# Patient Record
Sex: Female | Born: 1965 | Race: White | Hispanic: No | Marital: Married | Smoking: Never smoker
Health system: Southern US, Community
[De-identification: ages and names within clinical notes are randomized; demographics above are authoritative.]

## PROBLEM LIST (undated history)

## (undated) DIAGNOSIS — D649 Anemia, unspecified: Secondary | ICD-10-CM

## (undated) DIAGNOSIS — I10 Essential (primary) hypertension: Secondary | ICD-10-CM

## (undated) DIAGNOSIS — J45909 Unspecified asthma, uncomplicated: Secondary | ICD-10-CM

## (undated) DIAGNOSIS — K219 Gastro-esophageal reflux disease without esophagitis: Secondary | ICD-10-CM

## (undated) HISTORY — PX: REFRACTIVE SURGERY: SHX103

## (undated) HISTORY — DX: Unspecified asthma, uncomplicated: J45.909

## (undated) HISTORY — PX: TUBAL LIGATION: SHX77

## (undated) HISTORY — PX: ESOPHAGOGASTRODUODENOSCOPY: SHX1529

## (undated) HISTORY — PX: OTHER SURGICAL HISTORY: SHX169

## (undated) HISTORY — DX: Essential (primary) hypertension: I10

## (undated) HISTORY — PX: CHOLECYSTECTOMY: SHX55

---

## 2001-07-01 ENCOUNTER — Encounter: Payer: Self-pay | Admitting: Unknown Physician Specialty

## 2001-07-01 ENCOUNTER — Encounter: Admission: RE | Admit: 2001-07-01 | Discharge: 2001-07-01 | Payer: Self-pay | Admitting: Unknown Physician Specialty

## 2003-02-09 ENCOUNTER — Ambulatory Visit (HOSPITAL_COMMUNITY): Admission: RE | Admit: 2003-02-09 | Discharge: 2003-02-09 | Payer: Self-pay | Admitting: Urology

## 2003-02-11 ENCOUNTER — Ambulatory Visit (HOSPITAL_COMMUNITY): Admission: RE | Admit: 2003-02-11 | Discharge: 2003-02-11 | Payer: Self-pay | Admitting: Urology

## 2007-10-06 ENCOUNTER — Encounter: Admission: RE | Admit: 2007-10-06 | Discharge: 2007-10-06 | Payer: Self-pay | Admitting: Unknown Physician Specialty

## 2008-10-07 ENCOUNTER — Encounter: Admission: RE | Admit: 2008-10-07 | Discharge: 2008-10-07 | Payer: Self-pay | Admitting: Unknown Physician Specialty

## 2009-10-10 ENCOUNTER — Encounter: Admission: RE | Admit: 2009-10-10 | Discharge: 2009-10-10 | Payer: Self-pay | Admitting: Unknown Physician Specialty

## 2010-08-03 NOTE — H&P (Signed)
NAME:  Jean Jean [name], Jean Jean [name]                            ACCOUNT NO.:  [id number]   MEDICAL RECORD NO.:  [id number]                   PATIENT TYPE:  AMB   LOCATION:  DAY                                  FACILITY:  [facility]   PHYSICIAN:  Dennie Maizes, M.D.                DATE OF BIRTH:  Sep 23, 1965   DATE OF ADMISSION:  02/09/2003  DATE OF DISCHARGE:                                HISTORY & PHYSICAL   CHIEF COMPLAINT:  Severe left lower quadrant abdominal pain, suprapubic  pain, nausea and vomiting.   HISTORY OF PRESENT ILLNESS:  This 45 year old female has Jean [name] past history of  recurrent urolithiasis.  She had undergone ureteroscopy and stone extraction  about two years ago.  She experienced sudden onset of sharp, left lower  quadrant abdominal pain two weeks ago.  This was associated with nausea and  vomiting.  There was no radiation of pain to the back.  The patient was  evaluated in the emergency room at [facility].  CT of the  abdomen and pelvis revealed Jean [name] 10 x 5-mm-size left distal ureteral calculus  with obstruction and hydronephrosis.  The patient has not passed Jean [name] stone  yet.  She has relief of pain at present.  There is no history of fever,  chills or hematuria.  X-ray of the KUB area revealed presence of the stones  in the left distal ureteral area.  The patient is brought to the day  hospital today for extracorporeal shock wave lithotripsy of the obstructing  left distal ureteral calculus.   PAST MEDICAL HISTORY:  1. History of bronchial asthma.  2. Urolithiasis, status post ureteroscopic stone extraction.  3. Status post cholecystectomy.  4. Status post removal of left nasal polyps.   MEDICATIONS:  PERCOCET.   ALLERGIES:  None.   EXAMINATION:  GENERAL:  Height 5 feet 3 inches.  Weight 260 pounds.  HEENT:  Head, eyes, ears, nose and throat normal.  NECK:  No masses.  LUNGS:  Lungs clear to auscultation.  HEART:  Regular rate and rhythm.  No murmurs.  ABDOMEN:   Abdomen soft.  No palpable flank mass.  No costovertebral angle  tenderness.  Bladder is not palpable.  No suprapubic tenderness.   IMPRESSION:  Left distal ureteral calculus with obstruction, left  hydronephrosis, left renal colic.   PLAN:  I discussed with the patient regarding the management options.  She  is scheduled to undergo ESWL of the left distal ureteral calculus with IV  sedation in day hospital.  I have informed her regarding the diagnosis,  operative details, the alternate treatments, the outcome, possible risks and  complications and she has agreed for the procedure to be done.     ___________________________________________  Dennie Maizes, M.D.   SK/MEDQ  D:  02/09/2003  T:  02/09/2003  Job:  [id number]   cc:   Darliss CheneyD.

## 2010-09-05 ENCOUNTER — Other Ambulatory Visit: Payer: Self-pay | Admitting: Unknown Physician Specialty

## 2010-09-05 ENCOUNTER — Other Ambulatory Visit: Payer: Self-pay | Admitting: *Deleted

## 2010-09-05 DIAGNOSIS — Z1231 Encounter for screening mammogram for malignant neoplasm of breast: Secondary | ICD-10-CM

## 2010-10-15 ENCOUNTER — Ambulatory Visit
Admission: RE | Admit: 2010-10-15 | Discharge: 2010-10-15 | Disposition: A | Discharge status: Home / Self Care | Payer: 59 | Source: Ambulatory Visit | Attending: *Deleted | Admitting: *Deleted

## 2010-10-15 DIAGNOSIS — Z1231 Encounter for screening mammogram for malignant neoplasm of breast: Secondary | ICD-10-CM

## 2011-10-03 ENCOUNTER — Other Ambulatory Visit: Payer: Self-pay | Admitting: Unknown Physician Specialty

## 2011-10-03 DIAGNOSIS — Z1231 Encounter for screening mammogram for malignant neoplasm of breast: Secondary | ICD-10-CM

## 2011-10-25 ENCOUNTER — Ambulatory Visit
Admission: RE | Admit: 2011-10-25 | Discharge: 2011-10-25 | Disposition: A | Discharge status: Home / Self Care | Payer: BC Managed Care – PPO | Source: Ambulatory Visit | Attending: Unknown Physician Specialty | Admitting: Unknown Physician Specialty

## 2011-10-25 DIAGNOSIS — Z1231 Encounter for screening mammogram for malignant neoplasm of breast: Secondary | ICD-10-CM

## 2012-08-24 ENCOUNTER — Other Ambulatory Visit: Payer: Self-pay

## 2012-08-24 DIAGNOSIS — Z1231 Encounter for screening mammogram for malignant neoplasm of breast: Secondary | ICD-10-CM

## 2012-10-26 ENCOUNTER — Ambulatory Visit: Payer: BC Managed Care – PPO

## 2012-10-26 ENCOUNTER — Ambulatory Visit
Admission: RE | Admit: 2012-10-26 | Discharge: 2012-10-26 | Disposition: A | Discharge status: Home / Self Care | Payer: BC Managed Care – PPO | Source: Ambulatory Visit

## 2012-10-26 DIAGNOSIS — Z1231 Encounter for screening mammogram for malignant neoplasm of breast: Secondary | ICD-10-CM

## 2013-10-13 ENCOUNTER — Other Ambulatory Visit: Payer: Self-pay

## 2013-10-13 DIAGNOSIS — Z1231 Encounter for screening mammogram for malignant neoplasm of breast: Secondary | ICD-10-CM

## 2013-10-27 ENCOUNTER — Inpatient Hospital Stay: Admission: RE | Admit: 2013-10-27 | Payer: BC Managed Care – PPO | Source: Ambulatory Visit

## 2013-10-28 ENCOUNTER — Encounter (INDEPENDENT_AMBULATORY_CARE_PROVIDER_SITE_OTHER): Payer: Self-pay

## 2013-10-28 ENCOUNTER — Ambulatory Visit
Admission: RE | Admit: 2013-10-28 | Discharge: 2013-10-28 | Disposition: A | Discharge status: Home / Self Care | Payer: BC Managed Care – PPO | Source: Ambulatory Visit

## 2013-10-28 DIAGNOSIS — Z1231 Encounter for screening mammogram for malignant neoplasm of breast: Secondary | ICD-10-CM

## 2014-06-13 ENCOUNTER — Encounter (INDEPENDENT_AMBULATORY_CARE_PROVIDER_SITE_OTHER): Payer: Self-pay | Admitting: *Deleted

## 2014-07-06 ENCOUNTER — Ambulatory Visit (INDEPENDENT_AMBULATORY_CARE_PROVIDER_SITE_OTHER): Payer: Self-pay | Admitting: Internal Medicine

## 2014-07-07 ENCOUNTER — Other Ambulatory Visit (INDEPENDENT_AMBULATORY_CARE_PROVIDER_SITE_OTHER): Payer: Self-pay | Admitting: *Deleted

## 2014-07-07 ENCOUNTER — Ambulatory Visit (INDEPENDENT_AMBULATORY_CARE_PROVIDER_SITE_OTHER): Payer: BLUE CROSS/BLUE SHIELD | Admitting: Internal Medicine

## 2014-07-07 ENCOUNTER — Encounter (INDEPENDENT_AMBULATORY_CARE_PROVIDER_SITE_OTHER): Payer: Self-pay | Admitting: *Deleted

## 2014-07-07 ENCOUNTER — Encounter (INDEPENDENT_AMBULATORY_CARE_PROVIDER_SITE_OTHER): Payer: Self-pay | Admitting: Internal Medicine

## 2014-07-07 VITALS — BP 102/58 | HR 76 | Temp 97.6°F | Ht 63.0 in | Wt 276.4 lb

## 2014-07-07 DIAGNOSIS — R1314 Dysphagia, pharyngoesophageal phase: Secondary | ICD-10-CM | POA: Diagnosis not present

## 2014-07-07 DIAGNOSIS — K219 Gastro-esophageal reflux disease without esophagitis: Secondary | ICD-10-CM

## 2014-07-07 DIAGNOSIS — I10 Essential (primary) hypertension: Secondary | ICD-10-CM | POA: Insufficient documentation

## 2014-07-07 DIAGNOSIS — J45909 Unspecified asthma, uncomplicated: Secondary | ICD-10-CM | POA: Insufficient documentation

## 2014-07-07 DIAGNOSIS — R1310 Dysphagia, unspecified: Secondary | ICD-10-CM

## 2014-07-07 MED ORDER — OMEPRAZOLE 40 MG PO CPDR: 40.0000 mg | DELAYED_RELEASE_CAPSULE | Freq: Every day | ORAL | Status: DC

## 2014-07-07 NOTE — Progress Notes (Signed)
   Subjective:    Patient ID: Jean BlancAmy A Sevey, female    DOB: 07/17/1965, 49 y.o.   MRN: [id number]  HPI Presents today with c/o dysphagia. She tells me breads and meats are lodging in her esophagus. Foods will either go down slowly or she will have to force it back up. Symptoms for about a year and a half.  Hx of same and underwent and EGD/ED in 2011 for same. 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 Appetite is good. No weight loss. Usually has a BM . No melena or BRRB. No abdominal pain.   She denies acid reflux. No NSAIDs.    Review of Systems  Married. One child in good health.   Past Medical History  Diagnosis Date  . Asthma   . Hypertension     Past Surgical History  Procedure Laterality Date  . Tubal ligation    . Cholecystectomy      gallstones  . Cbd stones    . Kidney stones      laser, lithrotripsy  . Esophagogastroduodenoscopy      EGD/ED 2011.   Marland Kitchen. Refractive surgery      No Known Allergies  No current outpatient prescriptions on file prior to visit.   No current facility-administered medications on file prior to visit.        Objective:   Physical Exam Blood pressure 102/58, pulse 76, temperature 97.6 F (36.4 C), height 5\' 3"  (1.6 m), weight 276 lb 6.4 oz (125.374 kg).  Alert and oriented. Skin warm and dry. Oral mucosa is moist.   . Sclera anicteric, conjunctivae is pink. Thyroid not enlarged. No cervical lymphadenopathy. Lungs clear. Heart regular rate and rhythm.  Abdomen is soft. Bowel sounds are positive. No hepatomegaly. No abdominal masses felt. No tenderness.  No edema to lower extremities.         Assessment & Plan:  Solid food dysphagia. Esophageal ring needs to be ruled out. EGD/ED. The risks and benefits such as perforation, bleeding, and infection were reviewed with the patient and is agreeable.The risks and benefits such as perforation, bleeding, and infection were reviewed with the patient and is  agreeable. Rx for Omeprazole 40mg  sent to her pharmacy.

## 2014-07-07 NOTE — Patient Instructions (Signed)
EGD/ED. The risks and benefits such as perforation, bleeding, and infection were reviewed with the patient and is agreeable. Rx for Omeprazole.

## 2014-07-22 ENCOUNTER — Ambulatory Visit (HOSPITAL_COMMUNITY)
Admission: RE | Admit: 2014-07-22 | Discharge: 2014-07-22 | Disposition: A | Discharge status: Home / Self Care | Payer: BLUE CROSS/BLUE SHIELD | Source: Ambulatory Visit | Attending: Internal Medicine | Admitting: Internal Medicine

## 2014-07-22 ENCOUNTER — Encounter (HOSPITAL_COMMUNITY): Payer: Self-pay

## 2014-07-22 ENCOUNTER — Encounter (HOSPITAL_COMMUNITY)
Admission: RE | Disposition: A | Discharge status: Home / Self Care | Payer: Self-pay | Source: Ambulatory Visit | Attending: Internal Medicine

## 2014-07-22 DIAGNOSIS — K2901 Acute gastritis with bleeding: Secondary | ICD-10-CM | POA: Diagnosis not present

## 2014-07-22 DIAGNOSIS — J45909 Unspecified asthma, uncomplicated: Secondary | ICD-10-CM | POA: Insufficient documentation

## 2014-07-22 DIAGNOSIS — R1310 Dysphagia, unspecified: Secondary | ICD-10-CM | POA: Diagnosis not present

## 2014-07-22 DIAGNOSIS — K2950 Unspecified chronic gastritis without bleeding: Secondary | ICD-10-CM | POA: Insufficient documentation

## 2014-07-22 DIAGNOSIS — K228 Other specified diseases of esophagus: Secondary | ICD-10-CM | POA: Diagnosis not present

## 2014-07-22 DIAGNOSIS — Z87442 Personal history of urinary calculi: Secondary | ICD-10-CM | POA: Diagnosis not present

## 2014-07-22 DIAGNOSIS — D649 Anemia, unspecified: Secondary | ICD-10-CM | POA: Diagnosis not present

## 2014-07-22 DIAGNOSIS — K222 Esophageal obstruction: Secondary | ICD-10-CM | POA: Diagnosis not present

## 2014-07-22 DIAGNOSIS — K3189 Other diseases of stomach and duodenum: Secondary | ICD-10-CM | POA: Diagnosis not present

## 2014-07-22 DIAGNOSIS — K219 Gastro-esophageal reflux disease without esophagitis: Secondary | ICD-10-CM | POA: Diagnosis not present

## 2014-07-22 DIAGNOSIS — Z9851 Tubal ligation status: Secondary | ICD-10-CM | POA: Insufficient documentation

## 2014-07-22 DIAGNOSIS — Z9049 Acquired absence of other specified parts of digestive tract: Secondary | ICD-10-CM | POA: Diagnosis not present

## 2014-07-22 DIAGNOSIS — I10 Essential (primary) hypertension: Secondary | ICD-10-CM | POA: Diagnosis not present

## 2014-07-22 HISTORY — DX: Gastro-esophageal reflux disease without esophagitis: K21.9

## 2014-07-22 HISTORY — PX: ESOPHAGOGASTRODUODENOSCOPY: SHX5428

## 2014-07-22 HISTORY — PX: ESOPHAGEAL DILATION: SHX303

## 2014-07-22 HISTORY — DX: Anemia, unspecified: D64.9

## 2014-07-22 SURGERY — EGD (ESOPHAGOGASTRODUODENOSCOPY)
Anesthesia: Moderate Sedation

## 2014-07-22 MED ORDER — MEPERIDINE HCL 50 MG/ML IJ SOLN
INTRAMUSCULAR | Status: DC | PRN
  Administered 2014-07-22 (×2): 25 mg via INTRAVENOUS

## 2014-07-22 MED ORDER — MIDAZOLAM HCL 5 MG/5ML IJ SOLN
INTRAMUSCULAR | Status: AC
  Filled 2014-07-22: qty 10

## 2014-07-22 MED ORDER — BUTAMBEN-TETRACAINE-BENZOCAINE 2-2-14 % EX AERO
INHALATION_SPRAY | CUTANEOUS | Status: DC | PRN
  Administered 2014-07-22: 2 via TOPICAL

## 2014-07-22 MED ORDER — MIDAZOLAM HCL 5 MG/5ML IJ SOLN
INTRAMUSCULAR | Status: DC | PRN
  Administered 2014-07-22: 1 mg via INTRAVENOUS
  Administered 2014-07-22 (×3): 2 mg via INTRAVENOUS
  Administered 2014-07-22 (×3): 1 mg via INTRAVENOUS

## 2014-07-22 MED ORDER — SODIUM CHLORIDE 0.9 % IV SOLN
INTRAVENOUS | Status: DC
  Administered 2014-07-22: 08:00:00 via INTRAVENOUS

## 2014-07-22 MED ORDER — MEPERIDINE HCL 50 MG/ML IJ SOLN
INTRAMUSCULAR | Status: AC
  Filled 2014-07-22: qty 1

## 2014-07-22 MED ORDER — STERILE WATER FOR IRRIGATION IR SOLN
Status: DC | PRN
  Administered 2014-07-22: 09:00:00

## 2014-07-22 NOTE — Discharge Instructions (Signed)
Resume usual medications including omeprazole and take it 30 minutes before breakfast daily. Soft foods for 24 hours; then usual diet. No driving for 24 hours. Physician will call with biopsy results.  Esophagogastroduodenoscopy Care After Refer to this sheet in the next few weeks. These instructions provide you with information on caring for yourself after your procedure. Your caregiver may also give you more specific instructions. Your treatment has been planned according to current medical practices, but problems sometimes occur. Call your caregiver if you have any problems or questions after your procedure.  HOME CARE INSTRUCTIONS  Do not eat or drink anything until the numbing medicine (local anesthetic) has worn off and your gag reflex has returned. You will know that the local anesthetic has worn off when you can swallow comfortably.  Do not drive for 12 hours after the procedure or as directed by your caregiver.  Only take medicines as directed by your caregiver. SEEK MEDICAL CARE IF:   You cannot stop coughing.  You are not urinating at all or less than usual. SEEK IMMEDIATE MEDICAL CARE IF:  You have difficulty swallowing.  You cannot eat or drink.  You have worsening throat or chest pain.  You have dizziness, lightheadedness, or you faint.  You have nausea or vomiting.  You have chills.  You have a fever.  You have severe abdominal pain.  You have black, tarry, or bloody stools. Document Released: 02/19/2012 Document Reviewed: 02/19/2012 [facility] Patient Information 2015 [place], [place]. This information is not intended to replace advice given to you by your health care provider. Make sure you discuss any questions you have with your health care provider.

## 2014-07-22 NOTE — Op Note (Signed)
EGD PROCEDURE REPORT  PATIENT:  Jean [name]  MR#:  [id number] Birthdate:  10/27/1965, 49 y.o., female Endoscopist:  Dr. Malissa HippoNajeeb U. Maidie Streight, MD Referred By:  Dr. Ignatius Speckinghruv B. Vyas, MD Procedure Date: 07/22/2014  Procedure:   EGD with ED.  Indications:  Patient is [age] old Caucasian female who presents with one-year history of intermittent solid food dysphagia. Her esophagus was last dilated April 2011 admission she also had esophageal biopsy and was negative for eosinophilic esophagitis. She has occasional heartburn.            Informed Consent:  The risks, benefits, alternatives & imponderables which include, but are not limited to, bleeding, infection, perforation, drug reaction and potential missed lesion have been reviewed.  The potential for biopsy, lesion removal, esophageal dilation, etc. have also been discussed.  Questions have been answered.  All parties agreeable.  Please see history & physical in medical record for more information.  Medications:  Demerol 50 mg IV Versed 10 mg IV Cetacaine spray topically for oropharyngeal anesthesia  Description of procedure:  The endoscope was introduced through the mouth and advanced to the second portion of the duodenum without difficulty or limitations. The mucosal surfaces were surveyed very carefully during advancement of the scope and upon withdrawal.  Findings:  Esophagus:  Mucosa of proximal and middle segment was normal. Ring noted at GE junction along with two other incomplete rings. GE junction was wavy with single patch of salmon colored mucosa 12 mm tall. GEJ:  38 cm Stomach:  Stomach was empty and distended very well with insufflation. Folds in the proximal stomach normal. Examination of mucosa at gastric body was normal. Multiple anterior erosions noted. Pyloric channel was patent. Angularis fundus and cardia were normal. Duodenum:  Bulbar and post bulbar mucosa.  Therapeutic/Diagnostic Maneuvers Performed:   Esophagus dilated by passing  56 French Maloney dilator to full insertion. Endoscope was passed again and mucosa reexamined. About 2 cm linear mucosal disruption noted at distal esophagus straddling the ring. Biopsy was taken from salmon colored patch mucosa at distal esophagus. Antral biopsy was also taken for CLOtest.   Complications:  None  Impression: Ring at GE junction. Esophagus dilated by passing 56 French Maloney dilator and ring was disrupted.  Single patch of salmon current mucosa at distal esophagus. Biopsies taken post dilation to rule out short segment Barrett's esophagus.  Erosive antral gastritis. Biopsy taken for CLOtest.   Recommendations:  Standard instructions given. Patient advised to take omeprazole 40 mg by mouth every morning. I will be contacting patient with biopsy results and further recommendations.   Teiara Baria U  07/22/2014  9:48 AM  CC: Dr. Ignatius SpeckingVYAS,DHRUV B., MD & Dr. Bonnetta BarryNo ref. provider found

## 2014-07-22 NOTE — H&P (Signed)
Aryiana A Stevan BornHylton is an 49 y.o. female.   Chief Complaint: She is here for EGD and ED. HPI: A shunt is 49 year old Caucasian female who presents with one-year history of intermittent solid food dysphagia. She points to suprasternal area soft bolus obstruction. She usually gets spontaneous early but every now and then she has to regurgitate food bolus Spontaneously or self induced in order to get relief. She had has heartburn no more than once a week. She she is not taking PPI. She has good appetite and has not lost any weight. She denies melena. [facility]  Past Medical History  Diagnosis Date  . Asthma   . Hypertension   . GERD (gastroesophageal reflux disease)   . Anemia     Past Surgical History  Procedure Laterality Date  . Tubal ligation    . Cholecystectomy      gallstones  . Cbd stones    . Kidney stones      laser, lithrotripsy  . Esophagogastroduodenoscopy      EGD/ED 2011.   Marland Kitchen. Refractive surgery      History reviewed. No pertinent family history. Social History:  reports that she has never smoked. She does not have any smokeless tobacco history on file. She reports that she drinks alcohol. She reports that she does not use illicit drugs.  Allergies: No Known Allergies  Medications Prior to Admission  Medication Sig Dispense Refill  . budesonide-formoterol (SYMBICORT) 160-4.5 MCG/ACT inhaler Inhale 2 puffs into the lungs 2 (two) times daily.    . ergocalciferol (VITAMIN D2) 50000 UNITS capsule Take 50,000 Units by mouth once a week.    Marland Kitchen. lisinopril-hydrochlorothiazide (PRINZIDE,ZESTORETIC) 10-12.5 MG per tablet Take 1 tablet by mouth daily.    . montelukast (SINGULAIR) 10 MG tablet Take 10 mg by mouth at bedtime.    Marland Kitchen. omeprazole (PRILOSEC) 40 MG capsule Take 1 capsule (40 mg total) by mouth daily. 90 capsule 3  . albuterol (PROVENTIL HFA;VENTOLIN HFA) 108 (90 BASE) MCG/ACT inhaler Inhale into the lungs every 6 (six) hours as needed for wheezing  or shortness of breath.      No results found for this or any previous visit (from the past 48 hour(s)). No results found.  ROS  Blood pressure 113/64, pulse 71, temperature 98 F (36.7 C), temperature source Oral, resp. rate 18, height 5\' 3"  (1.6 m), weight 276 lb (125.193 kg), last menstrual period 06/22/2014, SpO2 100 %. Physical Exam  Constitutional:  Well-developed obese Caucasian female in NAD  HENT:  Mouth/Throat: Oropharynx is clear and moist.  Eyes: Conjunctivae are normal. No scleral icterus.  Neck: No thyromegaly present.  Cardiovascular: Normal rate, regular rhythm and normal heart sounds.   No murmur heard. Respiratory: Effort normal and breath sounds normal.  GI: Soft. She exhibits no distension and no mass. There is no tenderness.  Musculoskeletal: She exhibits no edema.  Lymphadenopathy:    She has no cervical adenopathy.  Neurological: She is alert.  Skin: Skin is warm and dry.     Assessment/Plan Solid food dysphagia. EGD with ED  Jahrel Borthwick U 07/22/2014, 9:02 AM

## 2014-07-23 LAB — CLOTEST (H. PYLORI), BIOPSY: Helicobacter screen: NEGATIVE

## 2014-07-25 ENCOUNTER — Encounter (HOSPITAL_COMMUNITY): Payer: Self-pay | Admitting: Internal Medicine

## 2014-08-01 ENCOUNTER — Encounter (INDEPENDENT_AMBULATORY_CARE_PROVIDER_SITE_OTHER): Payer: Self-pay | Admitting: *Deleted

## 2014-10-25 ENCOUNTER — Other Ambulatory Visit: Payer: Self-pay

## 2014-10-25 DIAGNOSIS — Z1231 Encounter for screening mammogram for malignant neoplasm of breast: Secondary | ICD-10-CM

## 2014-11-01 ENCOUNTER — Ambulatory Visit: Payer: BLUE CROSS/BLUE SHIELD

## 2014-11-15 ENCOUNTER — Ambulatory Visit: Payer: BLUE CROSS/BLUE SHIELD

## 2015-02-03 ENCOUNTER — Other Ambulatory Visit (INDEPENDENT_AMBULATORY_CARE_PROVIDER_SITE_OTHER): Payer: Self-pay | Admitting: Internal Medicine

## 2015-04-03 ENCOUNTER — Other Ambulatory Visit (INDEPENDENT_AMBULATORY_CARE_PROVIDER_SITE_OTHER): Payer: Self-pay | Admitting: Internal Medicine

## 2016-10-07 ENCOUNTER — Other Ambulatory Visit: Payer: Self-pay | Admitting: Obstetrics & Gynecology

## 2016-10-07 DIAGNOSIS — Z1231 Encounter for screening mammogram for malignant neoplasm of breast: Secondary | ICD-10-CM

## 2016-11-22 ENCOUNTER — Other Ambulatory Visit (HOSPITAL_COMMUNITY)
Admission: RE | Admit: 2016-11-22 | Discharge: 2016-11-22 | Disposition: A | Discharge status: Home / Self Care | Payer: Commercial Managed Care - PPO | Source: Ambulatory Visit | Attending: Obstetrics & Gynecology | Admitting: Obstetrics & Gynecology

## 2016-11-22 ENCOUNTER — Encounter: Payer: Self-pay | Admitting: Obstetrics & Gynecology

## 2016-11-22 ENCOUNTER — Ambulatory Visit (HOSPITAL_COMMUNITY)
Admission: RE | Admit: 2016-11-22 | Discharge: 2016-11-22 | Disposition: A | Discharge status: Home / Self Care | Payer: Commercial Managed Care - PPO | Source: Ambulatory Visit | Attending: Obstetrics & Gynecology | Admitting: Obstetrics & Gynecology

## 2016-11-22 ENCOUNTER — Ambulatory Visit (INDEPENDENT_AMBULATORY_CARE_PROVIDER_SITE_OTHER): Payer: Commercial Managed Care - PPO | Admitting: Obstetrics & Gynecology

## 2016-11-22 VITALS — BP 140/80 | HR 72 | Ht 63.0 in | Wt 266.4 lb

## 2016-11-22 DIAGNOSIS — Z1231 Encounter for screening mammogram for malignant neoplasm of breast: Secondary | ICD-10-CM | POA: Insufficient documentation

## 2016-11-22 DIAGNOSIS — Z01419 Encounter for gynecological examination (general) (routine) without abnormal findings: Secondary | ICD-10-CM | POA: Insufficient documentation

## 2016-11-22 MED ORDER — PROGESTERONE MICRONIZED 200 MG PO CAPS: 200.0000 mg | ORAL_CAPSULE | Freq: Every day | ORAL | 11 refills | Status: DC

## 2016-11-22 NOTE — Progress Notes (Signed)
Subjective:     Jean [name] is a 51 y.o. female here for a routine exam.  Patient's last menstrual period was 09/11/2016. G1P0 Birth Control Method:  Tubal ligation Menstrual Calendar(currently): Irregular bleeding  Current complaints: None.   Current acute medical issues:  None   Recent Gynecologic History Patient's last menstrual period was 09/11/2016. Last Pap: 2017,  normal Last mammogram: 2018,  normal  Past Medical History:  Diagnosis Date  . Anemia   . Asthma   . GERD (gastroesophageal reflux disease)   . Hypertension     Past Surgical History:  Procedure Laterality Date  . CBD stones    . CHOLECYSTECTOMY     gallstones  . ESOPHAGEAL DILATION N/A 07/22/2014   Procedure: ESOPHAGEAL DILATION;  Surgeon: Malissa Hippo, MD;  Location: [facility];  Service: Endoscopy;  Laterality: N/A;  . ESOPHAGOGASTRODUODENOSCOPY     EGD/ED 2011.   . ESOPHAGOGASTRODUODENOSCOPY N/A 07/22/2014   Procedure: ESOPHAGOGASTRODUODENOSCOPY (EGD);  Surgeon: Malissa Hippo, MD;  Location: [facility];  Service: Endoscopy;  Laterality: N/A;  855  . kidney stones     laser, lithrotripsy  . REFRACTIVE SURGERY    . TUBAL LIGATION      OB History    Gravida Para Term Preterm AB Living   1         1   SAB TAB Ectopic Multiple Live Births                  Social History   Socioeconomic History  . Marital status: Married    Spouse name: None  . Number of children: None  . Years of education: None  . Highest education level: None  Social Needs  . Financial resource strain: None  . Food insecurity - worry: None  . Food insecurity - inability: None  . Transportation needs - medical: None  . Transportation needs - non-medical: None  Occupational History  . None  Tobacco Use  . Smoking status: Never Smoker  . Smokeless tobacco: Never Used  Substance and Sexual Activity  . Alcohol use: Yes    Alcohol/week: 0.0 oz    Comment: rare  . Drug use: No  . Sexual activity: Yes    Birth  control/protection: Surgical  Other Topics Concern  . None  Social History Narrative  . None    Family History  Problem Relation Age of Onset  . Celiac disease Mother   . Stomach cancer Father   . Breast cancer Maternal Grandmother      Current Outpatient Medications:  .  albuterol (PROVENTIL HFA;VENTOLIN HFA) 108 (90 BASE) MCG/ACT inhaler, Inhale into the lungs every 6 (six) hours as needed for wheezing or shortness of breath., Disp: , Rfl:  .  budesonide-formoterol (SYMBICORT) 160-4.5 MCG/ACT inhaler, Inhale 2 puffs into the lungs 2 (two) times daily., Disp: , Rfl:  .  ergocalciferol (VITAMIN D2) 50000 UNITS capsule, Take 50,000 Units by mouth once a week., Disp: , Rfl:  .  fexofenadine-pseudoephedrine (ALLEGRA-D 24) 180-240 MG 24 hr tablet, Take 1 tablet by mouth daily., Disp: , Rfl:  .  meloxicam (MOBIC) 15 MG tablet, Take 15 mg by mouth daily., Disp: , Rfl:  .  montelukast (SINGULAIR) 10 MG tablet, Take 10 mg by mouth at bedtime., Disp: , Rfl:  .  Omega-3 Fatty Acids (FISH OIL) 1000 MG CAPS, Take by mouth., Disp: , Rfl:  .  omeprazole (PRILOSEC) 40 MG capsule, TAKE 1 CAPSULE BY MOUTH ONCE A  DAY., Disp: 30 capsule, Rfl: 0 .  pyridOXINE (VITAMIN B-6) 100 MG tablet, Take 100 mg by mouth daily., Disp: , Rfl:  .  vitamin B-12 (CYANOCOBALAMIN) 100 MCG tablet, Take 100 mcg by mouth daily., Disp: , Rfl:  .  lisinopril-hydrochlorothiazide (PRINZIDE,ZESTORETIC) 10-12.5 MG per tablet, Take 1 tablet by mouth daily., Disp: , Rfl:  .  progesterone (PROMETRIUM) 200 MG capsule, Take 1 capsule (200 mg total) by mouth daily., Disp: 30 capsule, Rfl: 11  Review of Systems  Review of Systems  Constitutional: Negative for fever, chills, weight loss, malaise/fatigue and diaphoresis.  HENT: Negative for hearing loss, ear pain, nosebleeds, congestion, sore throat, neck pain, tinnitus and ear discharge.   Eyes: Negative for blurred vision, double vision, photophobia, pain, discharge and redness.   Respiratory: Negative for cough, hemoptysis, sputum production, shortness of breath, wheezing and stridor.   Cardiovascular: Negative for chest pain, palpitations, orthopnea, claudication, leg swelling and PND.  Gastrointestinal: negative for abdominal pain. Negative for heartburn, nausea, vomiting, diarrhea, constipation, blood in stool and melena.  Genitourinary: Negative for dysuria, urgency, frequency, hematuria and flank pain.  Musculoskeletal: Negative for myalgias, back pain, joint pain and falls.  Skin: Negative for itching and rash.  Neurological: Negative for dizziness, tingling, tremors, sensory change, speech change, focal weakness, seizures, loss of consciousness, weakness and headaches.  Endo/Heme/Allergies: Negative for environmental allergies and polydipsia. Does not bruise/bleed easily.  Psychiatric/Behavioral: Negative for depression, suicidal ideas, hallucinations, memory loss and substance abuse. The patient is not nervous/anxious and does not have insomnia.        Objective:  Blood pressure 140/80, pulse 72, height 5\' 3"  (1.6 m), weight 266 lb 6.4 oz (120.8 kg), last menstrual period 09/11/2016.   Physical Exam  Vitals reviewed. Constitutional: She is oriented to person, place, and time. She appears well-developed and well-nourished.  HENT:  Head: Normocephalic and atraumatic.        Right Ear: External ear normal.  Left Ear: External ear normal.  Nose: Nose normal.  Mouth/Throat: Oropharynx is clear and moist.  Eyes: Conjunctivae and EOM are normal. Pupils are equal, round, and reactive to light. Right eye exhibits no discharge. Left eye exhibits no discharge. No scleral icterus.  Neck: Normal range of motion. Neck supple. No tracheal deviation present. No thyromegaly present.  Cardiovascular: Normal rate, regular rhythm, normal heart sounds and intact distal pulses.  Exam reveals no gallop and no friction rub.   No murmur heard. Respiratory: Effort normal and  breath sounds normal. No respiratory distress. She has no wheezes. She has no rales. She exhibits no tenderness.  GI: Soft. Bowel sounds are normal. She exhibits no distension and no mass. There is no tenderness. There is no rebound and no guarding.  Genitourinary:  Breasts no masses skin changes or nipple changes bilaterally      Vulva is normal without lesions Vagina is pink moist without discharge Cervix normal in appearance and pap is done Uterus is normal size shape and contour Adnexa is negative with normal sized ovaries   Musculoskeletal: Normal range of motion. She exhibits no edema and no tenderness.  Neurological: She is alert and oriented to person, place, and time. She has normal reflexes. She displays normal reflexes. No cranial nerve deficit. She exhibits normal muscle tone. Coordination normal.  Skin: Skin is warm and dry. No rash noted. No erythema. No pallor.  Psychiatric: She has a normal mood and affect. Her behavior is normal. Judgment and thought content normal.  Medications Ordered at today's visit: Meds ordered this encounter  Medications  . progesterone (PROMETRIUM) 200 MG capsule    Sig: Take 1 capsule (200 mg total) by mouth daily.    Dispense:  30 capsule    Refill:  11    Other orders placed at today's visit: No orders of the defined types were placed in this encounter.     Assessment:    Healthy female exam.    Plan:    Mammogram ordered. Follow up in: 1 year.   Cyclical prometrium 200 10 days monthly  Return in about 1 year (around 11/22/2017) for yearly, with Dr Despina Hidden.

## 2016-11-26 LAB — CYTOLOGY - PAP
DIAGNOSIS: NEGATIVE
HPV (WINDOPATH): NOT DETECTED

## 2017-07-07 ENCOUNTER — Ambulatory Visit (INDEPENDENT_AMBULATORY_CARE_PROVIDER_SITE_OTHER): Payer: Commercial Managed Care - PPO | Admitting: Obstetrics & Gynecology

## 2017-07-07 ENCOUNTER — Other Ambulatory Visit: Payer: Self-pay

## 2017-07-07 ENCOUNTER — Encounter: Payer: Self-pay | Admitting: Obstetrics & Gynecology

## 2017-07-07 VITALS — BP 132/80 | HR 100 | Ht 63.0 in | Wt 279.0 lb

## 2017-07-07 DIAGNOSIS — I889 Nonspecific lymphadenitis, unspecified: Secondary | ICD-10-CM

## 2017-07-07 MED ORDER — DOXYCYCLINE HYCLATE 100 MG PO TABS: 100.0000 mg | ORAL_TABLET | Freq: Two times a day (BID) | ORAL | 0 refills | Status: DC

## 2017-07-07 NOTE — Progress Notes (Signed)
Chief Complaint  Patient presents with  . Breast Mass    left armpit area      52 y.o. G1P0 No LMP recorded. (Menstrual status: Irregular Periods). The current method of family planning is n/a.  Outpatient Encounter Medications as of 07/07/2017  Medication Sig  . albuterol (PROVENTIL HFA;VENTOLIN HFA) 108 (90 BASE) MCG/ACT inhaler Inhale into the lungs every 6 (six) hours as needed for wheezing or shortness of breath.  . budesonide-formoterol (SYMBICORT) 160-4.5 MCG/ACT inhaler Inhale 2 puffs into the lungs 2 (two) times daily.  . montelukast (SINGULAIR) 10 MG tablet Take 10 mg by mouth at bedtime.  Marland Kitchen omeprazole (PRILOSEC) 40 MG capsule TAKE 1 CAPSULE BY MOUTH ONCE A DAY.  Marland Kitchen doxycycline (VIBRA-TABS) 100 MG tablet Take 1 tablet (100 mg total) by mouth 2 (two) times daily.  . ergocalciferol (VITAMIN D2) 50000 UNITS capsule Take 50,000 Units by mouth once a week.  . fexofenadine-pseudoephedrine (ALLEGRA-D 24) 180-240 MG 24 hr tablet Take 1 tablet by mouth daily.  Marland Kitchen lisinopril-hydrochlorothiazide (PRINZIDE,ZESTORETIC) 10-12.5 MG per tablet Take 1 tablet by mouth daily.  . meloxicam (MOBIC) 15 MG tablet Take 15 mg by mouth daily.  . Omega-3 Fatty Acids (FISH OIL) 1000 MG CAPS Take by mouth.  . progesterone (PROMETRIUM) 200 MG capsule Take 1 capsule (200 mg total) by mouth daily. (Patient not taking: Reported on 07/07/2017)  . pyridOXINE (VITAMIN B-6) 100 MG tablet Take 100 mg by mouth daily.  . vitamin B-12 (CYANOCOBALAMIN) 100 MCG tablet Take 100 mcg by mouth daily.   No facility-administered encounter medications on file as of 07/07/2017.     Subjective Jean [name] 10 day history of small lesion in the left axilla, non tender, no injury to left arm that she knows of, nothing makes it worse or better,  Past Medical History:  Diagnosis Date  . Anemia   . Asthma   . GERD (gastroesophageal reflux disease)   . Hypertension     Past Surgical History:  Procedure Laterality Date    . CBD stones    . CHOLECYSTECTOMY     gallstones  . ESOPHAGEAL DILATION N/A 07/22/2014   Procedure: ESOPHAGEAL DILATION;  Surgeon: Malissa Hippo, MD;  Location: [facility];  Service: Endoscopy;  Laterality: N/A;  . ESOPHAGOGASTRODUODENOSCOPY     EGD/ED 2011.   . ESOPHAGOGASTRODUODENOSCOPY N/A 07/22/2014   Procedure: ESOPHAGOGASTRODUODENOSCOPY (EGD);  Surgeon: Malissa Hippo, MD;  Location: [facility];  Service: Endoscopy;  Laterality: N/A;  855  . kidney stones     laser, lithrotripsy  . REFRACTIVE SURGERY    . TUBAL LIGATION      OB History    Gravida  1   Para      Term      Preterm      AB      Living  1     SAB      TAB      Ectopic      Multiple      Live Births              No Known Allergies  Social History   Socioeconomic History  . Marital status: Married    Spouse name: Not on file  . Number of children: Not on file  . Years of education: Not on file  . Highest education level: Not on file  Occupational History  . Not on file  Social Needs  . Financial resource strain:  Not on file  . Food insecurity:    Worry: Not on file    Inability: Not on file  . Transportation needs:    Medical: Not on file    Non-medical: Not on file  Tobacco Use  . Smoking status: Never Smoker  . Smokeless tobacco: Never Used  Substance and Sexual Activity  . Alcohol use: Yes    Alcohol/week: 0.0 oz    Comment: rare  . Drug use: No  . Sexual activity: Yes    Birth control/protection: Surgical  Lifestyle  . Physical activity:    Days per week: Not on file    Minutes per session: Not on file  . Stress: Not on file  Relationships  . Social connections:    Talks on phone: Not on file    Gets together: Not on file    Attends religious service: Not on file    Active member of club or organization: Not on file    Attends meetings of clubs or organizations: Not on file    Relationship status: Not on file  Other Topics Concern  . Not on file   Social History Narrative  . Not on file    Family History  Problem Relation Age of Onset  . Celiac disease Mother   . Stomach cancer Father   . Breast cancer Maternal Grandmother     Medications:       Current Outpatient Medications:  .  albuterol (PROVENTIL HFA;VENTOLIN HFA) 108 (90 BASE) MCG/ACT inhaler, Inhale into the lungs every 6 (six) hours as needed for wheezing or shortness of breath., Disp: , Rfl:  .  budesonide-formoterol (SYMBICORT) 160-4.5 MCG/ACT inhaler, Inhale 2 puffs into the lungs 2 (two) times daily., Disp: , Rfl:  .  montelukast (SINGULAIR) 10 MG tablet, Take 10 mg by mouth at bedtime., Disp: , Rfl:  .  omeprazole (PRILOSEC) 40 MG capsule, TAKE 1 CAPSULE BY MOUTH ONCE A DAY., Disp: 30 capsule, Rfl: 0 .  doxycycline (VIBRA-TABS) 100 MG tablet, Take 1 tablet (100 mg total) by mouth 2 (two) times daily., Disp: 20 tablet, Rfl: 0 .  ergocalciferol (VITAMIN D2) 50000 UNITS capsule, Take 50,000 Units by mouth once a week., Disp: , Rfl:  .  fexofenadine-pseudoephedrine (ALLEGRA-D 24) 180-240 MG 24 hr tablet, Take 1 tablet by mouth daily., Disp: , Rfl:  .  lisinopril-hydrochlorothiazide (PRINZIDE,ZESTORETIC) 10-12.5 MG per tablet, Take 1 tablet by mouth daily., Disp: , Rfl:  .  meloxicam (MOBIC) 15 MG tablet, Take 15 mg by mouth daily., Disp: , Rfl:  .  Omega-3 Fatty Acids (FISH OIL) 1000 MG CAPS, Take by mouth., Disp: , Rfl:  .  progesterone (PROMETRIUM) 200 MG capsule, Take 1 capsule (200 mg total) by mouth daily. (Patient not taking: Reported on 07/07/2017), Disp: 30 capsule, Rfl: 11 .  pyridOXINE (VITAMIN B-6) 100 MG tablet, Take 100 mg by mouth daily., Disp: , Rfl:  .  vitamin B-12 (CYANOCOBALAMIN) 100 MCG tablet, Take 100 mcg by mouth daily., Disp: , Rfl:   Objective Blood pressure 132/80, pulse 100, height 5\' 3"  (1.6 m), weight 279 lb (126.6 kg).  Gen WDWN NAD Left axilla 1 cm rubbery node vs sebaceous cyst, favor lymph node, no left arm injury is seen, no rashes or  erythema is noted  Pertinent ROS No burning with urination, frequency or urgency No nausea, vomiting or diarrhea Nor fever chills or other constitutional symptoms   Labs or studies     Impression Diagnoses this Encounter::  ICD-10-CM   1. Lymphadenitis I88.9     Established relevant diagnosis(es): Normal mammogram in septemeber 2018  Plan/Recommendations: Meds ordered this encounter  Medications  . doxycycline (VIBRA-TABS) 100 MG tablet    Sig: Take 1 tablet (100 mg total) by mouth 2 (two) times daily.    Dispense:  20 tablet    Refill:  0    Labs or Scans Ordered: No orders of the defined types were placed in this encounter.   Management:: Doxycycline for 10 days then re examine  Follow up Return in about 2 weeks (around 07/21/2017) for Follow up, with Dr Despina HiddenEure.        All questions were answered.

## 2017-07-21 ENCOUNTER — Ambulatory Visit: Payer: Commercial Managed Care - PPO | Admitting: Obstetrics & Gynecology

## 2017-07-22 ENCOUNTER — Encounter: Payer: Self-pay | Admitting: Obstetrics & Gynecology

## 2017-07-22 ENCOUNTER — Ambulatory Visit (INDEPENDENT_AMBULATORY_CARE_PROVIDER_SITE_OTHER): Payer: Commercial Managed Care - PPO | Admitting: Obstetrics & Gynecology

## 2017-07-22 VITALS — BP 136/80 | HR 76 | Ht 63.0 in | Wt 279.5 lb

## 2017-07-22 DIAGNOSIS — I889 Nonspecific lymphadenitis, unspecified: Secondary | ICD-10-CM

## 2017-07-22 NOTE — Progress Notes (Signed)
Chief Complaint  Patient presents with  . Follow-up      52 y.o. G1P0 No LMP recorded. (Menstrual status: Irregular Periods). The current method of family planning is tubal ligation.  Outpatient Encounter Medications as of 07/22/2017  Medication Sig  . albuterol (PROVENTIL HFA;VENTOLIN HFA) 108 (90 BASE) MCG/ACT inhaler Inhale into the lungs every 6 (six) hours as needed for wheezing or shortness of breath.  . budesonide-formoterol (SYMBICORT) 160-4.5 MCG/ACT inhaler Inhale 2 puffs into the lungs 2 (two) times daily.  . montelukast (SINGULAIR) 10 MG tablet Take 10 mg by mouth at bedtime.  Marland Kitchen omeprazole (PRILOSEC) 40 MG capsule TAKE 1 CAPSULE BY MOUTH ONCE A DAY.  Marland Kitchen doxycycline (VIBRA-TABS) 100 MG tablet Take 1 tablet (100 mg total) by mouth 2 (two) times daily.  . ergocalciferol (VITAMIN D2) 50000 UNITS capsule Take 50,000 Units by mouth once a week.  . fexofenadine-pseudoephedrine (ALLEGRA-D 24) 180-240 MG 24 hr tablet Take 1 tablet by mouth daily.  Marland Kitchen lisinopril-hydrochlorothiazide (PRINZIDE,ZESTORETIC) 10-12.5 MG per tablet Take 1 tablet by mouth daily.  . meloxicam (MOBIC) 15 MG tablet Take 15 mg by mouth daily.  . Omega-3 Fatty Acids (FISH OIL) 1000 MG CAPS Take by mouth.  . progesterone (PROMETRIUM) 200 MG capsule Take 1 capsule (200 mg total) by mouth daily. (Patient not taking: Reported on 07/07/2017)  . pyridOXINE (VITAMIN B-6) 100 MG tablet Take 100 mg by mouth daily.  . vitamin B-12 (CYANOCOBALAMIN) 100 MCG tablet Take 100 mcg by mouth daily.   No facility-administered encounter medications on file as of 07/22/2017.     Subjective Jean [name] comes back in for evlauation of the area under the left axilla She states it is smaller less tender and much less bothersome/noticable No fevers chills or any other associated symptoms Past Medical History:  Diagnosis Date  . Anemia   . Asthma   . GERD (gastroesophageal reflux disease)   . Hypertension     Past Surgical  History:  Procedure Laterality Date  . CBD stones    . CHOLECYSTECTOMY     gallstones  . ESOPHAGEAL DILATION N/A 07/22/2014   Procedure: ESOPHAGEAL DILATION;  Surgeon: Malissa Hippo, MD;  Location: [facility];  Service: Endoscopy;  Laterality: N/A;  . ESOPHAGOGASTRODUODENOSCOPY     EGD/ED 2011.   . ESOPHAGOGASTRODUODENOSCOPY N/A 07/22/2014   Procedure: ESOPHAGOGASTRODUODENOSCOPY (EGD);  Surgeon: Malissa Hippo, MD;  Location: [facility];  Service: Endoscopy;  Laterality: N/A;  855  . kidney stones     laser, lithrotripsy  . REFRACTIVE SURGERY    . TUBAL LIGATION      OB History    Gravida  1   Para      Term      Preterm      AB      Living  1     SAB      TAB      Ectopic      Multiple      Live Births              No Known Allergies  Social History   Socioeconomic History  . Marital status: Married    Spouse name: Not on file  . Number of children: Not on file  . Years of education: Not on file  . Highest education level: Not on file  Occupational History  . Not on file  Social Needs  . Financial resource strain: Not on file  .  Food insecurity:    Worry: Not on file    Inability: Not on file  . Transportation needs:    Medical: Not on file    Non-medical: Not on file  Tobacco Use  . Smoking status: Never Smoker  . Smokeless tobacco: Never Used  Substance and Sexual Activity  . Alcohol use: Yes    Alcohol/week: 0.0 oz    Comment: rare  . Drug use: No  . Sexual activity: Yes    Birth control/protection: Surgical  Lifestyle  . Physical activity:    Days per week: Not on file    Minutes per session: Not on file  . Stress: Not on file  Relationships  . Social connections:    Talks on phone: Not on file    Gets together: Not on file    Attends religious service: Not on file    Active member of club or organization: Not on file    Attends meetings of clubs or organizations: Not on file    Relationship status: Not on file  Other  Topics Concern  . Not on file  Social History Narrative  . Not on file    Family History  Problem Relation Age of Onset  . Celiac disease Mother   . Stomach cancer Father   . Breast cancer Maternal Grandmother     Medications:       Current Outpatient Medications:  .  albuterol (PROVENTIL HFA;VENTOLIN HFA) 108 (90 BASE) MCG/ACT inhaler, Inhale into the lungs every 6 (six) hours as needed for wheezing or shortness of breath., Disp: , Rfl:  .  budesonide-formoterol (SYMBICORT) 160-4.5 MCG/ACT inhaler, Inhale 2 puffs into the lungs 2 (two) times daily., Disp: , Rfl:  .  montelukast (SINGULAIR) 10 MG tablet, Take 10 mg by mouth at bedtime., Disp: , Rfl:  .  omeprazole (PRILOSEC) 40 MG capsule, TAKE 1 CAPSULE BY MOUTH ONCE A DAY., Disp: 30 capsule, Rfl: 0 .  doxycycline (VIBRA-TABS) 100 MG tablet, Take 1 tablet (100 mg total) by mouth 2 (two) times daily., Disp: 20 tablet, Rfl: 0 .  ergocalciferol (VITAMIN D2) 50000 UNITS capsule, Take 50,000 Units by mouth once a week., Disp: , Rfl:  .  fexofenadine-pseudoephedrine (ALLEGRA-D 24) 180-240 MG 24 hr tablet, Take 1 tablet by mouth daily., Disp: , Rfl:  .  lisinopril-hydrochlorothiazide (PRINZIDE,ZESTORETIC) 10-12.5 MG per tablet, Take 1 tablet by mouth daily., Disp: , Rfl:  .  meloxicam (MOBIC) 15 MG tablet, Take 15 mg by mouth daily., Disp: , Rfl:  .  Omega-3 Fatty Acids (FISH OIL) 1000 MG CAPS, Take by mouth., Disp: , Rfl:  .  progesterone (PROMETRIUM) 200 MG capsule, Take 1 capsule (200 mg total) by mouth daily. (Patient not taking: Reported on 07/07/2017), Disp: 30 capsule, Rfl: 11 .  pyridOXINE (VITAMIN B-6) 100 MG tablet, Take 100 mg by mouth daily., Disp: , Rfl:  .  vitamin B-12 (CYANOCOBALAMIN) 100 MCG tablet, Take 100 mcg by mouth daily., Disp: , Rfl:   Objective Blood pressure 136/80, pulse 76, height  (1.6 m), weight 279 lb 8 oz (126.8 kg).  Gen WDWN NAD Left axilla with small superficial cyst or lymph node wihich is about 0.5  cm non tender with no skin changes,   Pertinent ROS No burning with urination, frequency or urgency No nausea, vomiting or diarrhea Nor fever chills or other constitutional symptoms   Labs or studies Mammogram 11/22/2016 was normal    Impression Diagnoses this Encounter::   ICD-10-CM  1. Lymphadenitis I88.9     Established relevant diagnosis(es):   Plan/Recommendations: No orders of the defined types were placed in this encounter.   Labs or Scans Ordered: No orders of the defined types were placed in this encounter.   Management:: Return if the node or cyst flares back up or any other issues  Follow up Return in about 5 months (around 12/22/2017), or if symptoms worsen or fail to improve, for yearly, with Dr Despina Hidden.      All questions were answered.

## 2017-09-09 ENCOUNTER — Other Ambulatory Visit: Payer: Self-pay | Admitting: Obstetrics & Gynecology

## 2017-09-09 DIAGNOSIS — Z1231 Encounter for screening mammogram for malignant neoplasm of breast: Secondary | ICD-10-CM

## 2017-11-27 ENCOUNTER — Other Ambulatory Visit (HOSPITAL_COMMUNITY)
Admission: RE | Admit: 2017-11-27 | Discharge: 2017-11-27 | Disposition: A | Discharge status: Home / Self Care | Payer: Commercial Managed Care - PPO | Source: Ambulatory Visit | Attending: Obstetrics & Gynecology | Admitting: Obstetrics & Gynecology

## 2017-11-27 ENCOUNTER — Ambulatory Visit (HOSPITAL_COMMUNITY)
Admission: RE | Admit: 2017-11-27 | Discharge: 2017-11-27 | Disposition: A | Discharge status: Home / Self Care | Payer: Commercial Managed Care - PPO | Source: Ambulatory Visit | Attending: Obstetrics & Gynecology | Admitting: Obstetrics & Gynecology

## 2017-11-27 ENCOUNTER — Ambulatory Visit (INDEPENDENT_AMBULATORY_CARE_PROVIDER_SITE_OTHER): Payer: Commercial Managed Care - PPO | Admitting: Obstetrics & Gynecology

## 2017-11-27 ENCOUNTER — Other Ambulatory Visit: Payer: Self-pay

## 2017-11-27 ENCOUNTER — Encounter: Payer: Self-pay | Admitting: Obstetrics & Gynecology

## 2017-11-27 DIAGNOSIS — Z01419 Encounter for gynecological examination (general) (routine) without abnormal findings: Secondary | ICD-10-CM

## 2017-11-27 DIAGNOSIS — Z1231 Encounter for screening mammogram for malignant neoplasm of breast: Secondary | ICD-10-CM | POA: Insufficient documentation

## 2017-11-27 MED ORDER — PROGESTERONE MICRONIZED 200 MG PO CAPS: 200.0000 mg | ORAL_CAPSULE | Freq: Every day | ORAL | 11 refills | Status: DC

## 2017-11-27 NOTE — Progress Notes (Signed)
Subjective:     Jean [name] is a 52 y.o. female here for a routine exam.  Patient's last menstrual period was 11/24/2017. G1P0 Birth Control Method:  BTL Menstrual Calendar(currently): regular but lighter  Current complaints: none.   Current acute medical issues:  Asthma, GI issues   Recent Gynecologic History Patient's last menstrual period was 11/24/2017. Last Pap: 2018,  normal Last mammogram: 2018,  normal  Past Medical History:  Diagnosis Date  . Anemia   . Asthma   . GERD (gastroesophageal reflux disease)   . Hypertension     Past Surgical History:  Procedure Laterality Date  . CBD stones    . CHOLECYSTECTOMY     gallstones  . ESOPHAGEAL DILATION N/A 07/22/2014   Procedure: ESOPHAGEAL DILATION;  Surgeon: Malissa HippoNajeeb U Rehman, MD;  Location: [facility];  Service: Endoscopy;  Laterality: N/A;  . ESOPHAGOGASTRODUODENOSCOPY     EGD/ED 2011.   . ESOPHAGOGASTRODUODENOSCOPY N/A 07/22/2014   Procedure: ESOPHAGOGASTRODUODENOSCOPY (EGD);  Surgeon: Malissa HippoNajeeb U Rehman, MD;  Location: [facility];  Service: Endoscopy;  Laterality: N/A;  855  . kidney stones     laser, lithrotripsy  . REFRACTIVE SURGERY    . TUBAL LIGATION      OB History    Gravida  1   Para      Term      Preterm      AB      Living  1     SAB      TAB      Ectopic      Multiple      Live Births              Social History   Socioeconomic History  . Marital status: Married    Spouse name: Not on file  . Number of children: Not on file  . Years of education: Not on file  . Highest education level: Not on file  Occupational History  . Not on file  Social Needs  . Financial resource strain: Not on file  . Food insecurity:    Worry: Not on file    Inability: Not on file  . Transportation needs:    Medical: Not on file    Non-medical: Not on file  Tobacco Use  . Smoking status: Never Smoker  . Smokeless tobacco: Never Used  Substance and Sexual Activity  . Alcohol use: Yes   Alcohol/week: 0.0 standard drinks    Comment: rare  . Drug use: No  . Sexual activity: Yes    Birth control/protection: Surgical  Lifestyle  . Physical activity:    Days per week: Not on file    Minutes per session: Not on file  . Stress: Not on file  Relationships  . Social connections:    Talks on phone: Not on file    Gets together: Not on file    Attends religious service: Not on file    Active member of club or organization: Not on file    Attends meetings of clubs or organizations: Not on file    Relationship status: Not on file  Other Topics Concern  . Not on file  Social History Narrative  . Not on file    Family History  Problem Relation Age of Onset  . Celiac disease Mother   . Stomach cancer Father   . Breast cancer Maternal Grandmother      Current Outpatient Medications:  .  albuterol (PROVENTIL HFA;VENTOLIN HFA) 108 (  90 BASE) MCG/ACT inhaler, Inhale into the lungs every 6 (six) hours as needed for wheezing or shortness of breath., Disp: , Rfl:  .  budesonide-formoterol (SYMBICORT) 160-4.5 MCG/ACT inhaler, Inhale 2 puffs into the lungs 2 (two) times daily., Disp: , Rfl:  .  montelukast (SINGULAIR) 10 MG tablet, Take 10 mg by mouth at bedtime., Disp: , Rfl:  .  omeprazole (PRILOSEC) 40 MG capsule, TAKE 1 CAPSULE BY MOUTH ONCE A DAY., Disp: 30 capsule, Rfl: 0 .  progesterone (PROMETRIUM) 200 MG capsule, Take 1 capsule (200 mg total) by mouth daily., Disp: 30 capsule, Rfl: 11  Review of Systems  Review of Systems  Constitutional: Negative for fever, chills, weight loss, malaise/fatigue and diaphoresis.  HENT: Negative for hearing loss, ear pain, nosebleeds, congestion, sore throat, neck pain, tinnitus and ear discharge.   Eyes: Negative for blurred vision, double vision, photophobia, pain, discharge and redness.  Respiratory: Negative for cough, hemoptysis, sputum production, shortness of breath, wheezing and stridor.   Cardiovascular: Negative for chest pain,  palpitations, orthopnea, claudication, leg swelling and PND.  Gastrointestinal: negative for abdominal pain. Negative for heartburn, nausea, vomiting, diarrhea, constipation, blood in stool and melena.  Genitourinary: Negative for dysuria, urgency, frequency, hematuria and flank pain.  Musculoskeletal: Negative for myalgias, back pain, joint pain and falls.  Skin: Negative for itching and rash.  Neurological: Negative for dizziness, tingling, tremors, sensory change, speech change, focal weakness, seizures, loss of consciousness, weakness and headaches.  Endo/Heme/Allergies: Negative for environmental allergies and polydipsia. Does not bruise/bleed easily.  Psychiatric/Behavioral: Negative for depression, suicidal ideas, hallucinations, memory loss and substance abuse. The patient is not nervous/anxious and does not have insomnia.        Objective:  Last menstrual period 11/24/2017.   Physical Exam  Vitals reviewed. Constitutional: She is oriented to person, place, and time. She appears well-developed and well-nourished.  HENT:  Head: Normocephalic and atraumatic.        Right Ear: External ear normal.  Left Ear: External ear normal.  Nose: Nose normal.  Mouth/Throat: Oropharynx is clear and moist.  Eyes: Conjunctivae and EOM are normal. Pupils are equal, round, and reactive to light. Right eye exhibits no discharge. Left eye exhibits no discharge. No scleral icterus.  Neck: Normal range of motion. Neck supple. No tracheal deviation present. No thyromegaly present.  Cardiovascular: Normal rate, regular rhythm, normal heart sounds and intact distal pulses.  Exam reveals no gallop and no friction rub.   No murmur heard. Respiratory: Effort normal and breath sounds normal. No respiratory distress. She has no wheezes. She has no rales. She exhibits no tenderness.  GI: Soft. Bowel sounds are normal. She exhibits no distension and no mass. There is no tenderness. There is no rebound and no  guarding.  Genitourinary:  Breasts no masses skin changes or nipple changes bilaterally      Vulva is normal without lesions Vagina is pink moist without discharge Cervix normal in appearance and pap is done Uterus is normal size shape and contour Adnexa is negative with normal sized ovaries   Musculoskeletal: Normal range of motion. She exhibits no edema and no tenderness.  Neurological: She is alert and oriented to person, place, and time. She has normal reflexes. She displays normal reflexes. No cranial nerve deficit. She exhibits normal muscle tone. Coordination normal.  Skin: Skin is warm and dry. No rash noted. No erythema. No pallor.  Psychiatric: She has a normal mood and affect. Her behavior is normal. Judgment and thought content  normal.       Medications Ordered at today's visit: No orders of the defined types were placed in this encounter.   Other orders placed at today's visit: No orders of the defined types were placed in this encounter.     Assessment:    Healthy female exam.    Plan:    Contraception: tubal ligation. Mammogram ordered. Follow up in: 2 years.     Return in about 2 years (around 11/28/2019) for yearly.

## 2017-11-28 LAB — CYTOLOGY - PAP
Diagnosis: NEGATIVE
HPV: NOT DETECTED

## 2018-09-24 ENCOUNTER — Ambulatory Visit (INDEPENDENT_AMBULATORY_CARE_PROVIDER_SITE_OTHER): Payer: Commercial Managed Care - PPO | Admitting: Otolaryngology

## 2018-09-24 DIAGNOSIS — J310 Chronic rhinitis: Secondary | ICD-10-CM

## 2018-09-24 DIAGNOSIS — R0982 Postnasal drip: Secondary | ICD-10-CM | POA: Diagnosis not present

## 2018-09-24 DIAGNOSIS — J320 Chronic maxillary sinusitis: Secondary | ICD-10-CM

## 2018-10-27 ENCOUNTER — Telehealth: Payer: Self-pay | Admitting: Obstetrics & Gynecology

## 2018-10-27 MED ORDER — PROGESTERONE MICRONIZED 200 MG PO CAPS: 200.0000 mg | ORAL_CAPSULE | Freq: Every day | ORAL | 11 refills | Status: DC

## 2018-10-27 NOTE — Telephone Encounter (Signed)
Patient called stating that she would like a refill of her medication Progesterone 200 mg. Pt states that Dr. Elonda Husky told her she did not need to come back this year for a pap. Please contact pt

## 2018-10-27 NOTE — Telephone Encounter (Signed)
done

## 2018-11-09 ENCOUNTER — Other Ambulatory Visit: Payer: Self-pay

## 2018-11-09 DIAGNOSIS — Z20822 Contact with and (suspected) exposure to covid-19: Secondary | ICD-10-CM

## 2018-11-10 LAB — NOVEL CORONAVIRUS, NAA: SARS-CoV-2, NAA: NOT DETECTED

## 2018-12-04 ENCOUNTER — Other Ambulatory Visit: Payer: Self-pay

## 2018-12-04 ENCOUNTER — Ambulatory Visit (HOSPITAL_COMMUNITY)
Admission: RE | Admit: 2018-12-04 | Discharge: 2018-12-04 | Disposition: A | Discharge status: Home / Self Care | Payer: Commercial Managed Care - PPO | Source: Ambulatory Visit | Attending: Diagnostic Radiology | Admitting: Diagnostic Radiology

## 2018-12-04 ENCOUNTER — Other Ambulatory Visit (HOSPITAL_COMMUNITY): Payer: Self-pay

## 2018-12-04 DIAGNOSIS — Z1231 Encounter for screening mammogram for malignant neoplasm of breast: Secondary | ICD-10-CM | POA: Insufficient documentation

## 2019-02-04 ENCOUNTER — Other Ambulatory Visit: Payer: Self-pay | Admitting: *Deleted

## 2019-02-04 MED ORDER — PROGESTERONE MICRONIZED 200 MG PO CAPS: 200.0000 mg | ORAL_CAPSULE | Freq: Every day | ORAL | 4 refills | Status: DC

## 2019-08-24 IMAGING — MG DIGITAL SCREENING BILATERAL MAMMOGRAM WITH TOMO AND CAD
8 series · 8 of 24 positions shown · non-contrast
Comparison: Previous exam(s).

ACR Breast Density Category a: The breast tissue is almost entirely
fatty.

CLINICAL DATA: Screening.

EXAM:
DIGITAL SCREENING BILATERAL MAMMOGRAM WITH TOMO AND CAD

[R CC synth-2D]
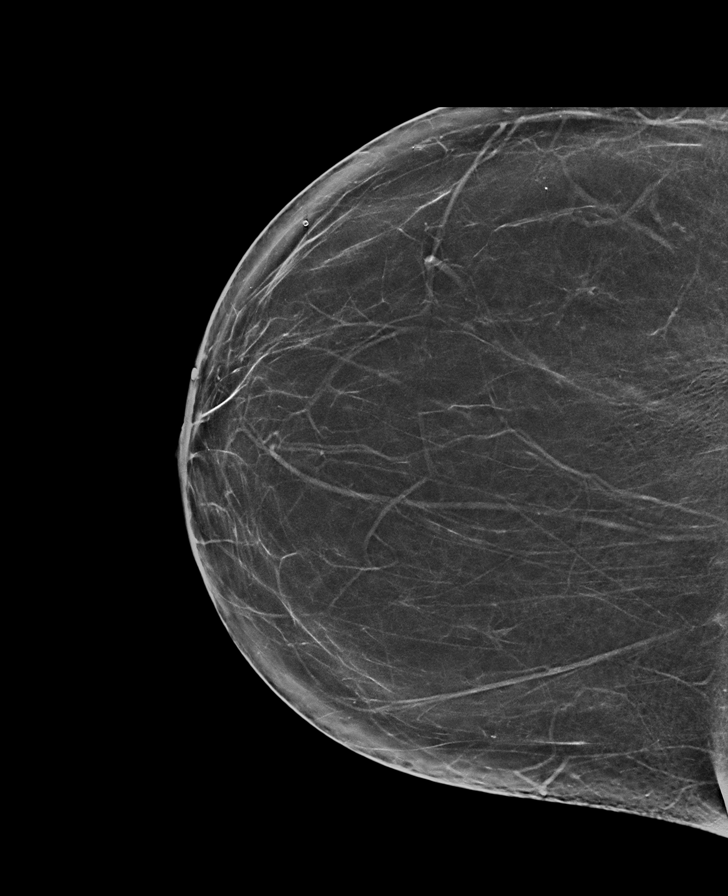

[R MLO synth-2D]
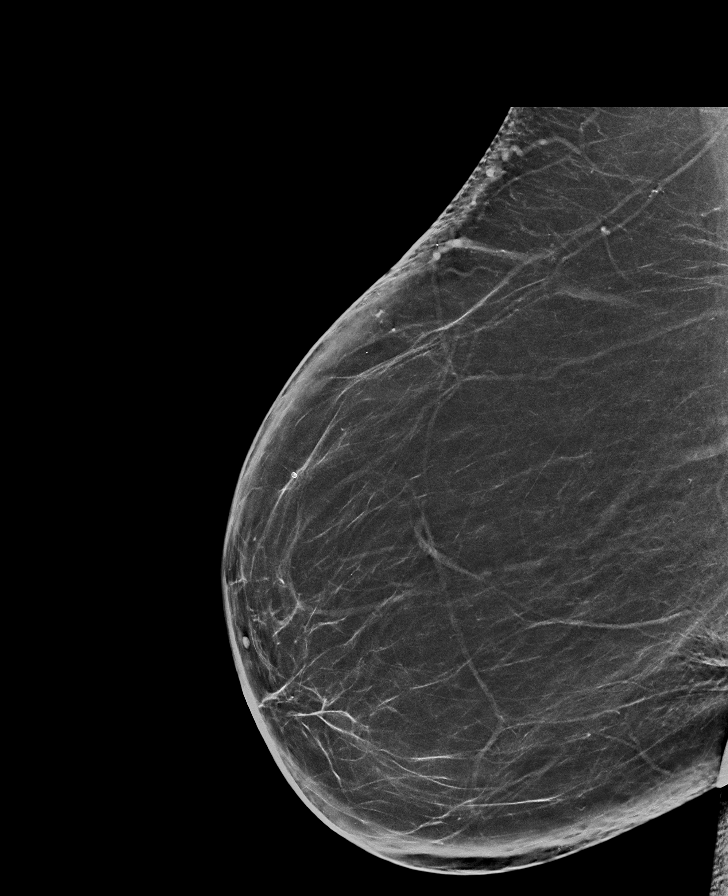

[L CC synth-2D]
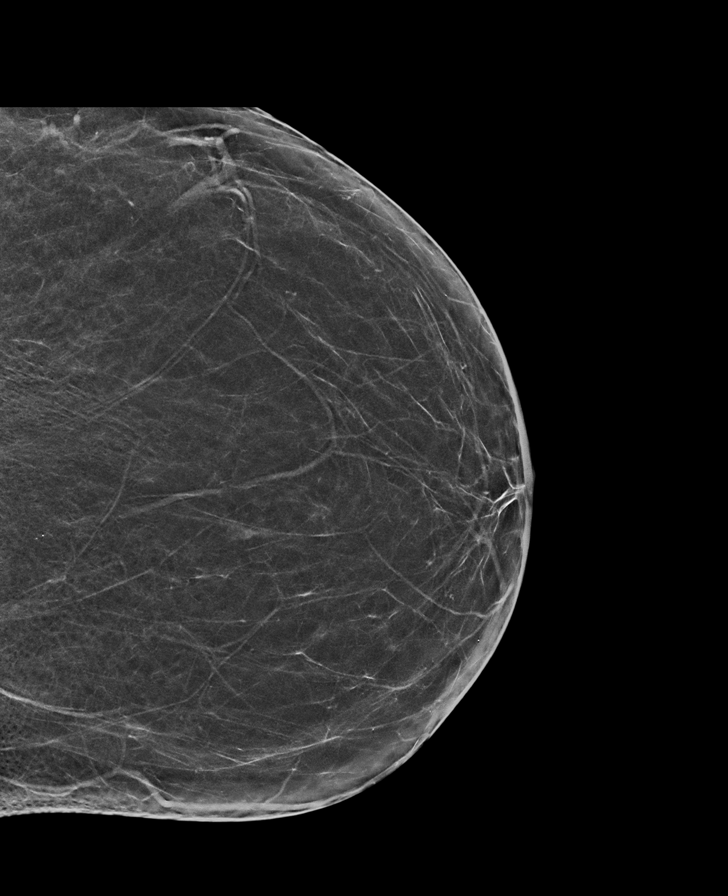

[L MLO synth-2D]
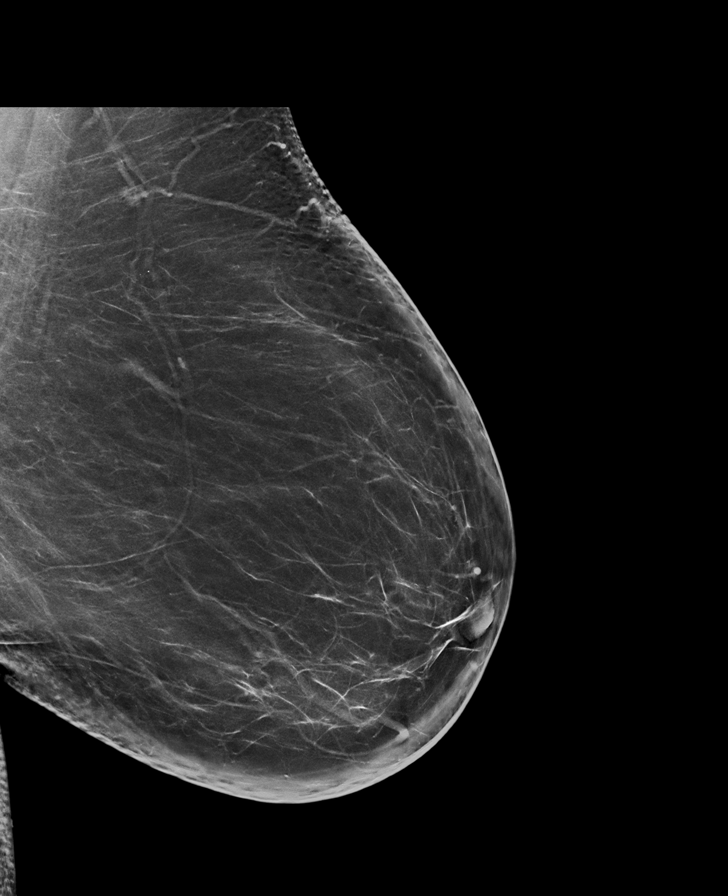

[R CC tomo · tomo slice 35/70.0]
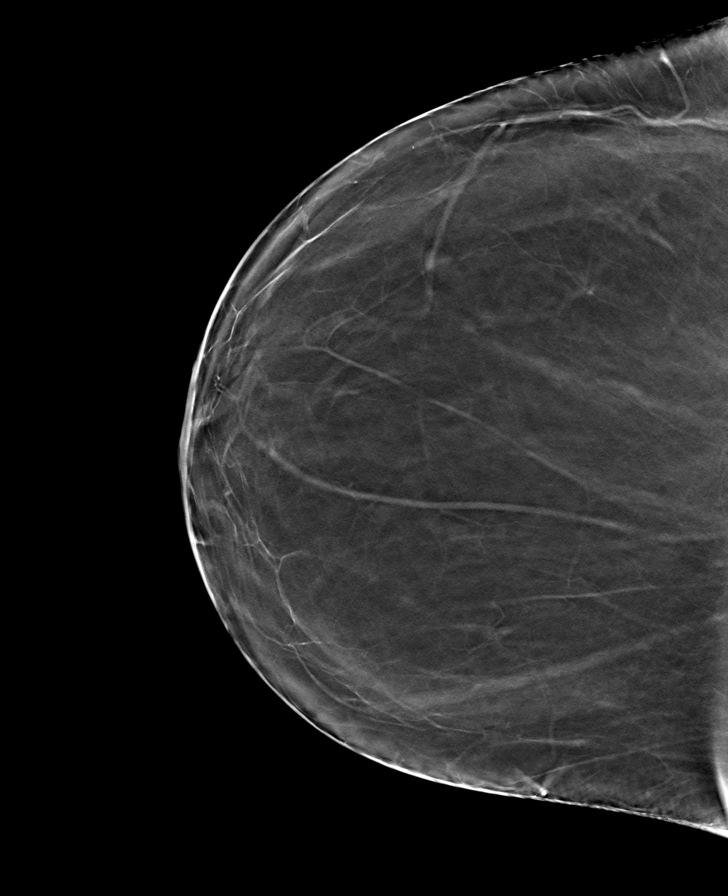

[L CC tomo · tomo slice 33/64.0]
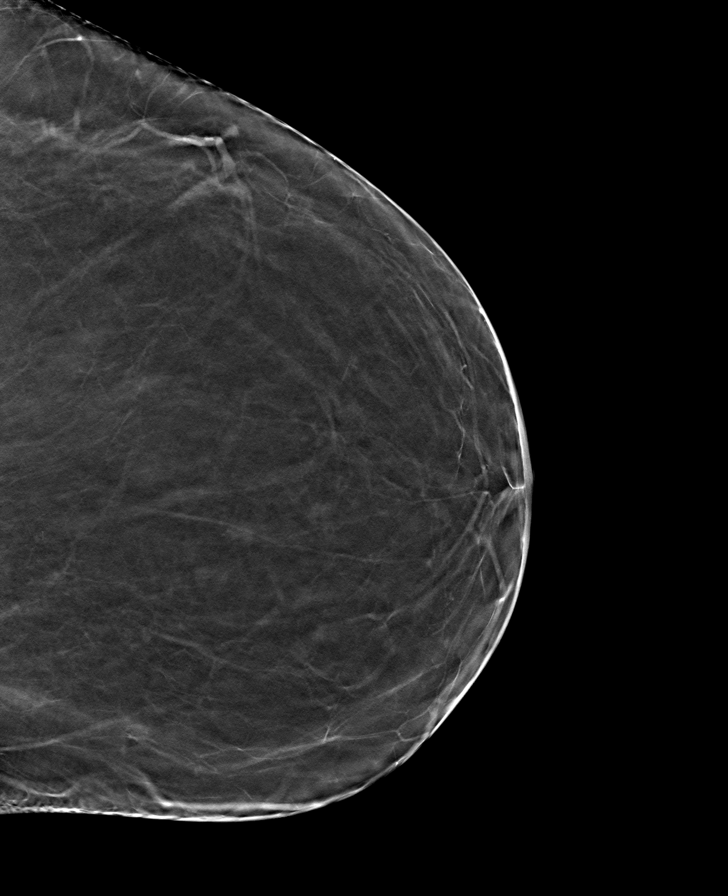

[R MLO tomo · tomo slice 41/82.0]
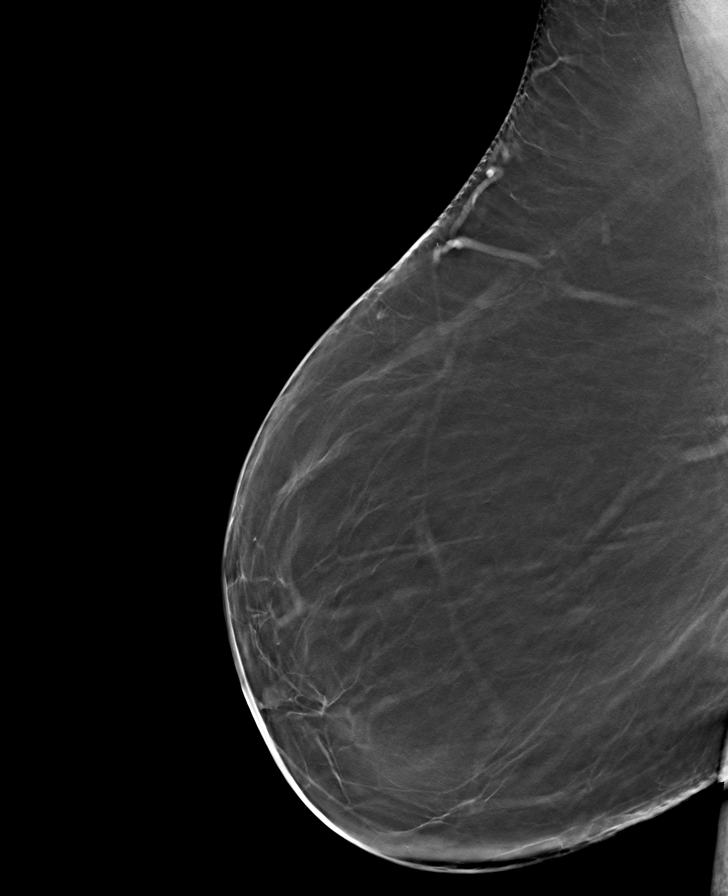

[L MLO tomo · tomo slice 43/86.0]
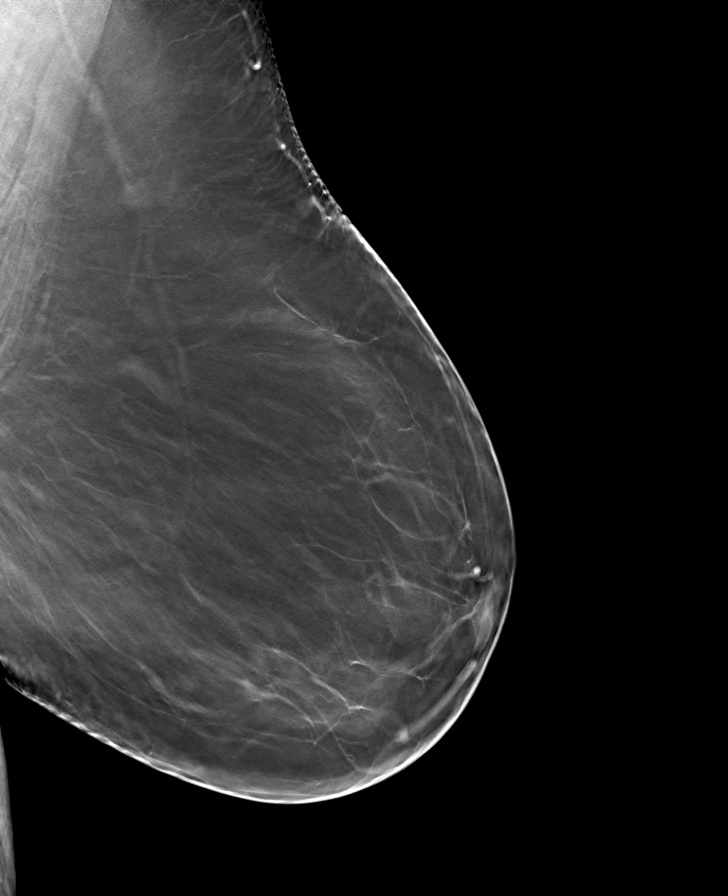

[8 of 24 positions shown; findings below may reference images not displayed]

FINDINGS: There are no findings suspicious for malignancy. Images were
processed with CAD.
IMPRESSION: No mammographic evidence of malignancy. A result letter of this
screening mammogram will be mailed directly to the patient.

RECOMMENDATION:
Screening mammogram in one year. (Code:8Y-Q-VVS)

BI-RADS CATEGORY  1: Negative.

## 2019-12-20 ENCOUNTER — Other Ambulatory Visit (HOSPITAL_COMMUNITY): Payer: Self-pay | Admitting: Obstetrics & Gynecology

## 2019-12-20 DIAGNOSIS — Z1231 Encounter for screening mammogram for malignant neoplasm of breast: Secondary | ICD-10-CM

## 2019-12-27 ENCOUNTER — Other Ambulatory Visit: Payer: Self-pay

## 2019-12-27 ENCOUNTER — Ambulatory Visit (HOSPITAL_COMMUNITY)
Admission: RE | Admit: 2019-12-27 | Discharge: 2019-12-27 | Disposition: A | Discharge status: Home / Self Care | Payer: Commercial Managed Care - PPO | Source: Ambulatory Visit | Attending: Obstetrics & Gynecology | Admitting: Obstetrics & Gynecology

## 2019-12-27 DIAGNOSIS — Z1231 Encounter for screening mammogram for malignant neoplasm of breast: Secondary | ICD-10-CM | POA: Diagnosis present

## 2020-10-24 ENCOUNTER — Other Ambulatory Visit (HOSPITAL_COMMUNITY): Payer: Self-pay | Admitting: Obstetrics & Gynecology

## 2020-10-24 DIAGNOSIS — Z1231 Encounter for screening mammogram for malignant neoplasm of breast: Secondary | ICD-10-CM

## 2020-12-04 ENCOUNTER — Other Ambulatory Visit: Payer: Self-pay

## 2020-12-04 ENCOUNTER — Other Ambulatory Visit (HOSPITAL_COMMUNITY)
Admission: RE | Admit: 2020-12-04 | Discharge: 2020-12-04 | Disposition: A | Discharge status: Home / Self Care | Payer: Commercial Managed Care - PPO | Source: Ambulatory Visit | Attending: Obstetrics & Gynecology | Admitting: Obstetrics & Gynecology

## 2020-12-04 ENCOUNTER — Encounter: Payer: Self-pay | Admitting: Obstetrics & Gynecology

## 2020-12-04 ENCOUNTER — Ambulatory Visit (INDEPENDENT_AMBULATORY_CARE_PROVIDER_SITE_OTHER): Payer: Commercial Managed Care - PPO | Admitting: Obstetrics & Gynecology

## 2020-12-04 VITALS — BP 159/90 | HR 86 | Ht 63.0 in | Wt 261.0 lb

## 2020-12-04 DIAGNOSIS — Z1211 Encounter for screening for malignant neoplasm of colon: Secondary | ICD-10-CM

## 2020-12-04 DIAGNOSIS — Z1212 Encounter for screening for malignant neoplasm of rectum: Secondary | ICD-10-CM

## 2020-12-04 DIAGNOSIS — Z01419 Encounter for gynecological examination (general) (routine) without abnormal findings: Secondary | ICD-10-CM | POA: Insufficient documentation

## 2020-12-04 LAB — HEMOCCULT GUIAC POC 1CARD (OFFICE): Fecal Occult Blood, POC: NEGATIVE

## 2020-12-04 NOTE — Progress Notes (Signed)
Subjective:     Jean [name] is a 55 y.o. female here for a routine exam.  Patient's last menstrual period was 11/24/2017. G1P1001 Birth Control Method:  menopausal Menstrual Calendar(currently): amenorrheic  Current complaints: none.   Current acute medical issues:     Recent Gynecologic History Patient's last menstrual period was 11/24/2017. Last Pap: 11/2017,  normal Last mammogram: 10/21,  normal  Past Medical History:  Diagnosis Date   Anemia    Asthma    GERD (gastroesophageal reflux disease)    Hypertension     Past Surgical History:  Procedure Laterality Date   CBD stones     CHOLECYSTECTOMY     gallstones   ESOPHAGEAL DILATION N/A 07/22/2014   Procedure: ESOPHAGEAL DILATION;  Surgeon: Malissa Hippo, MD;  [facility];  Service: Endoscopy;  Laterality: N/A;   ESOPHAGOGASTRODUODENOSCOPY     EGD/ED 2011.    ESOPHAGOGASTRODUODENOSCOPY N/A 07/22/2014   Procedure: ESOPHAGOGASTRODUODENOSCOPY (EGD);  Surgeon: Malissa Hippo, MD;  [facility]: [facility];  Service: Endoscopy;  Laterality: N/A;  855   kidney stones     laser, lithrotripsy   REFRACTIVE SURGERY     TUBAL LIGATION      OB History     Gravida  1   Para  1   Term  1   Preterm      AB      Living  1      SAB      IAB      Ectopic      Multiple      Live Births  1           Social History   Socioeconomic History   Marital status: Married    Spouse name: Not on file   Number of children: Not on file   Years of education: Not on file   Highest education level: Not on file  Occupational History   Not on file  Tobacco Use   Smoking status: Never   Smokeless tobacco: Never  Vaping Use   Vaping Use: Never used  Substance and Sexual Activity   Alcohol use: Yes    Alcohol/week: 0.0 standard drinks    Comment: rare   Drug use: No   Sexual activity: Yes    Birth control/protection: Surgical    Comment: tubal  Other Topics Concern   Not on file  Social History  Narrative   Not on file   Social Determinants of Health   Financial Resource Strain: Low Risk    Difficulty of Paying Living Expenses: Not hard at all  Food Insecurity: No Food Insecurity   Worried About Programme researcher, broadcasting/film/video in the Last Year: Never true   Ran Out of Food in the Last Year: Never true  Transportation Needs: No Transportation Needs   Lack of Transportation (Medical): No   Lack of Transportation (Non-Medical): No  Physical Activity: Inactive   Days of Exercise per Week: 0 days   Minutes of Exercise per Session: 0 min  Stress: No Stress Concern Present   Feeling of Stress : Not at all  Social Connections: Moderately Isolated   Frequency of Communication with Friends and Family: More than three times a week   Frequency of Social Gatherings with Friends and Family: Once a week   Attends Religious Services: Never   Database administrator or Organizations: No   Attends Banker Meetings: Never   Marital Status: Married  Family History  Problem Relation Age of Onset   Celiac disease Mother    Stomach cancer Father    Breast cancer Maternal Grandmother      Current Outpatient Medications:    albuterol (PROVENTIL HFA;VENTOLIN HFA) 108 (90 BASE) MCG/ACT inhaler, Inhale into the lungs every 6 (six) hours as needed for wheezing or shortness of breath., Disp: , Rfl:    aspirin EC 81 MG tablet, Take 81 mg by mouth daily. Swallow whole., Disp: , Rfl:    BIOTIN PO, Take 1,000 mcg by mouth., Disp: , Rfl:    budesonide-formoterol (SYMBICORT) 160-4.5 MCG/ACT inhaler, Inhale 2 puffs into the lungs 2 (two) times daily., Disp: , Rfl:    cetirizine (ZYRTEC) 10 MG tablet, Take 10 mg by mouth daily., Disp: , Rfl:    Cholecalciferol (VITAMIN D3 PO), Take 50 mcg by mouth., Disp: , Rfl:    metFORMIN (GLUCOPHAGE) 500 MG tablet, Take 500 mg by mouth 2 (two) times daily with a meal., Disp: , Rfl:    montelukast (SINGULAIR) 10 MG tablet, Take 10 mg by mouth at bedtime., Disp: ,  Rfl:    Omega-3 Fatty Acids (OMEGA 3 PO), Take 900 mg by mouth., Disp: , Rfl:    omeprazole (PRILOSEC) 20 MG capsule, Take 20 mg by mouth daily., Disp: , Rfl:   Review of Systems  Review of Systems  Constitutional: Negative for fever, chills, weight loss, malaise/fatigue and diaphoresis.  HENT: Negative for hearing loss, ear pain, nosebleeds, congestion, sore throat, neck pain, tinnitus and ear discharge.   Eyes: Negative for blurred vision, double vision, photophobia, pain, discharge and redness.  Respiratory: Negative for cough, hemoptysis, sputum production, shortness of breath, wheezing and stridor.   Cardiovascular: Negative for chest pain, palpitations, orthopnea, claudication, leg swelling and PND.  Gastrointestinal: negative for abdominal pain. Negative for heartburn, nausea, vomiting, diarrhea, constipation, blood in stool and melena.  Genitourinary: Negative for dysuria, urgency, frequency, hematuria and flank pain.  Musculoskeletal: Negative for myalgias, back pain, joint pain and falls.  Skin: Negative for itching and rash.  Neurological: Negative for dizziness, tingling, tremors, sensory change, speech change, focal weakness, seizures, loss of consciousness, weakness and headaches.  Endo/Heme/Allergies: Negative for environmental allergies and polydipsia. Does not bruise/bleed easily.  Psychiatric/Behavioral: Negative for depression, suicidal ideas, hallucinations, memory loss and substance abuse. The patient is not nervous/anxious and does not have insomnia.        Objective:  Blood pressure (!) 159/90, pulse 86, height 5\' 3"  (1.6 m), weight 261 lb (118.4 kg), last menstrual period 11/24/2017.   Physical Exam  Vitals reviewed. Constitutional: She is oriented to person, place, and time. She appears well-developed and well-nourished.  HENT:  Head: Normocephalic and atraumatic.        Right Ear: External ear normal.  Left Ear: External ear normal.  Nose: Nose normal.   Mouth/Throat: Oropharynx is clear and moist.  Eyes: Conjunctivae and EOM are normal. Pupils are equal, round, and reactive to light. Right eye exhibits no discharge. Left eye exhibits no discharge. No scleral icterus.  Neck: Normal range of motion. Neck supple. No tracheal deviation present. No thyromegaly present.  Cardiovascular: Normal rate, regular rhythm, normal heart sounds and intact distal pulses.  Exam reveals no gallop and no friction rub.   No murmur heard. Respiratory: Effort normal and breath sounds normal. No respiratory distress. She has no wheezes. She has no rales. She exhibits no tenderness.  GI: Soft. Bowel sounds are normal. She exhibits no distension and no  mass. There is no tenderness. There is no rebound and no guarding.  Genitourinary:  Breasts no masses skin changes or nipple changes bilaterally      Vulva is normal without lesions Vagina is pink moist without discharge Cervix normal in appearance and pap is done Uterus is normal size shape and contour Adnexa is negative with normal sized ovaries  {Rectal    hemoccult negative, normal tone, no masses  Musculoskeletal: Normal range of motion. She exhibits no edema and no tenderness.  Neurological: She is alert and oriented to person, place, and time. She has normal reflexes. She displays normal reflexes. No cranial nerve deficit. She exhibits normal muscle tone. Coordination normal.  Skin: Skin is warm and dry. No rash noted. No erythema. No pallor.  Psychiatric: She has a normal mood and affect. Her behavior is normal. Judgment and thought content normal.       Medications Ordered at today's visit: No orders of the defined types were placed in this encounter.   Other orders placed at today's visit: No orders of the defined types were placed in this encounter.     Assessment:    Normal Gyn exam.    Plan:    Education reviewed: diabetes. Contraception: post menopausal status. Follow up in: 3 years.      Return in about 3 years (around 12/05/2023) for Follow up, yearly.

## 2020-12-04 NOTE — Addendum Note (Signed)
Addended by: Colen Darling on: 12/04/2020 09:41 AM   Modules accepted: Orders

## 2020-12-05 LAB — CYTOLOGY - PAP
Comment: NEGATIVE
Diagnosis: NEGATIVE
High risk HPV: NEGATIVE

## 2020-12-28 ENCOUNTER — Other Ambulatory Visit: Payer: Self-pay

## 2020-12-28 ENCOUNTER — Ambulatory Visit (HOSPITAL_COMMUNITY)
Admission: RE | Admit: 2020-12-28 | Discharge: 2020-12-28 | Disposition: A | Discharge status: Home / Self Care | Payer: Commercial Managed Care - PPO | Source: Ambulatory Visit | Attending: Obstetrics & Gynecology | Admitting: Obstetrics & Gynecology

## 2020-12-28 DIAGNOSIS — Z1231 Encounter for screening mammogram for malignant neoplasm of breast: Secondary | ICD-10-CM | POA: Insufficient documentation

## 2021-10-11 ENCOUNTER — Other Ambulatory Visit (HOSPITAL_COMMUNITY): Payer: Self-pay | Admitting: Obstetrics & Gynecology

## 2021-10-11 ENCOUNTER — Other Ambulatory Visit (HOSPITAL_COMMUNITY): Payer: Self-pay | Admitting: Internal Medicine

## 2021-10-11 DIAGNOSIS — Z1231 Encounter for screening mammogram for malignant neoplasm of breast: Secondary | ICD-10-CM

## 2021-12-31 ENCOUNTER — Ambulatory Visit (HOSPITAL_COMMUNITY): Payer: Commercial Managed Care - PPO

## 2021-12-31 ENCOUNTER — Other Ambulatory Visit (HOSPITAL_COMMUNITY): Payer: Self-pay | Admitting: Radiology

## 2022-01-16 ENCOUNTER — Ambulatory Visit (HOSPITAL_COMMUNITY)
Admission: RE | Admit: 2022-01-16 | Discharge: 2022-01-16 | Disposition: A | Discharge status: Home / Self Care | Payer: Commercial Managed Care - PPO | Source: Ambulatory Visit | Attending: Internal Medicine | Admitting: Internal Medicine

## 2022-01-16 DIAGNOSIS — Z1231 Encounter for screening mammogram for malignant neoplasm of breast: Secondary | ICD-10-CM | POA: Diagnosis present

## 2022-02-03 ENCOUNTER — Encounter (INDEPENDENT_AMBULATORY_CARE_PROVIDER_SITE_OTHER): Payer: Self-pay | Admitting: Gastroenterology

## 2023-01-16 ENCOUNTER — Other Ambulatory Visit (HOSPITAL_COMMUNITY): Payer: Self-pay | Admitting: Internal Medicine

## 2023-01-16 DIAGNOSIS — Z1231 Encounter for screening mammogram for malignant neoplasm of breast: Secondary | ICD-10-CM

## 2023-01-20 ENCOUNTER — Ambulatory Visit (HOSPITAL_COMMUNITY)
Admission: RE | Admit: 2023-01-20 | Discharge: 2023-01-20 | Disposition: A | Discharge status: Home / Self Care | Payer: Commercial Managed Care - PPO | Source: Ambulatory Visit | Attending: Internal Medicine | Admitting: Internal Medicine

## 2023-01-20 DIAGNOSIS — Z1231 Encounter for screening mammogram for malignant neoplasm of breast: Secondary | ICD-10-CM | POA: Diagnosis present

## 2023-02-21 ENCOUNTER — Encounter: Payer: Self-pay | Admitting: Obstetrics & Gynecology

## 2023-02-21 ENCOUNTER — Ambulatory Visit: Payer: Commercial Managed Care - PPO | Admitting: Obstetrics & Gynecology

## 2023-02-21 ENCOUNTER — Other Ambulatory Visit (HOSPITAL_COMMUNITY)
Admission: RE | Admit: 2023-02-21 | Discharge: 2023-02-21 | Disposition: A | Discharge status: Home / Self Care | Payer: Commercial Managed Care - PPO | Source: Ambulatory Visit | Attending: Obstetrics & Gynecology | Admitting: Obstetrics & Gynecology

## 2023-02-21 VITALS — BP 152/84 | HR 68 | Ht 63.0 in | Wt 228.0 lb

## 2023-02-21 DIAGNOSIS — Z1212 Encounter for screening for malignant neoplasm of rectum: Secondary | ICD-10-CM

## 2023-02-21 DIAGNOSIS — Z1211 Encounter for screening for malignant neoplasm of colon: Secondary | ICD-10-CM

## 2023-02-21 DIAGNOSIS — Z01419 Encounter for gynecological examination (general) (routine) without abnormal findings: Secondary | ICD-10-CM | POA: Insufficient documentation

## 2023-02-21 LAB — HEMOCCULT GUIAC POC 1CARD (OFFICE): Fecal Occult Blood, POC: NEGATIVE

## 2023-02-21 NOTE — Addendum Note (Signed)
Addended by: Moss Mc on: 02/21/2023 10:55 AM   Modules accepted: Orders

## 2023-02-21 NOTE — Progress Notes (Signed)
Subjective:     Jean [name] is a 57 y.o. female here for a routine exam.  Patient's last menstrual period was 11/24/2017. G1P1001 Birth Control Method:  BTL, menopausal Menstrual Calendar(currently): amenorrheic  Current complaints: none.   Current acute medical issues:  none   Recent Gynecologic History Patient's last menstrual period was 11/24/2017. Last Pap: 2022,  normal Last mammogr11/4/2024am: normal,    Past Medical History:  Diagnosis Date   Anemia    Asthma    GERD (gastroesophageal reflux disease)    Hypertension     Past Surgical History:  Procedure Laterality Date   CBD stones     CHOLECYSTECTOMY     gallstones   ESOPHAGEAL DILATION N/A 07/22/2014   Procedure: ESOPHAGEAL DILATION;  Surgeon: Malissa Hippo, MD;  [facility];  Service: Endoscopy;  Laterality: N/A;   ESOPHAGOGASTRODUODENOSCOPY     EGD/ED 2011.    ESOPHAGOGASTRODUODENOSCOPY N/A 07/22/2014   Procedure: ESOPHAGOGASTRODUODENOSCOPY (EGD);  Surgeon: Malissa Hippo, MD;  [facility];  Service: Endoscopy;  Laterality: N/A;  855   kidney stones     laser, lithrotripsy   REFRACTIVE SURGERY     TUBAL LIGATION      OB History     Gravida  1   Para  1   Term  1   Preterm      AB      Living  1      SAB      IAB      Ectopic      Multiple      Live Births  1           Social History   Socioeconomic History   Marital status: Married    Spouse name: Not on file   Number of children: Not on file   Years of education: Not on file   Highest education level: Not on file  Occupational History   Not on file  Tobacco Use   Smoking status: Never   Smokeless tobacco: Never  Vaping Use   Vaping status: Never Used  Substance and Sexual Activity   Alcohol use: Yes    Alcohol/week: 0.0 standard drinks of alcohol    Comment: rare   Drug use: No   Sexual activity: Yes    Birth control/protection: Surgical    Comment: tubal  Other Topics Concern   Not on  file  Social History Narrative   Not on file   Social Determinants of Health   Financial Resource Strain: Low Risk  (12/04/2020)   Overall Financial Resource Strain (CARDIA)    Difficulty of Paying Living Expenses: Not hard at all  Food Insecurity: No Food Insecurity (12/04/2020)   Hunger Vital Sign    Worried About Running Out of Food in the Last Year: Never true    Ran Out of Food in the Last Year: Never true  Transportation Needs: No Transportation Needs (12/04/2020)   PRAPARE - Administrator, Civil Service (Medical): No    Lack of Transportation (Non-Medical): No  Physical Activity: Inactive (12/04/2020)   Exercise Vital Sign    Days of Exercise per Week: 0 days    Minutes of Exercise per Session: 0 min  Stress: No Stress Concern Present (12/04/2020)   [employer] of Occupational Health - Occupational Stress Questionnaire    Feeling of Stress : Not at all  Social Connections: Moderately Isolated (12/04/2020)   Social Connection and Isolation Panel [NHANES]  Frequency of Communication with Friends and Family: More than three times a week    Frequency of Social Gatherings with Friends and Family: Once a week    Attends Religious Services: Never    Database administrator or Organizations: No    Attends Engineer, structural: Never    Marital Status: Married    Family History  Problem Relation Age of Onset   Breast cancer Maternal Grandmother    Stomach cancer Father    Coronary artery disease Father    Celiac disease Mother      Current Outpatient Medications:    albuterol (PROVENTIL HFA;VENTOLIN HFA) 108 (90 BASE) MCG/ACT inhaler, Inhale into the lungs every 6 (six) hours as needed for wheezing or shortness of breath., Disp: , Rfl:    ascorbic acid (VITAMIN C) 500 MG tablet, Take 500 mg by mouth daily., Disp: , Rfl:    aspirin EC 81 MG tablet, Take 81 mg by mouth daily. Swallow whole., Disp: , Rfl:    BIOTIN PO, Take 1,000 mcg by mouth., Disp: ,  Rfl:    budesonide-formoterol (SYMBICORT) 160-4.5 MCG/ACT inhaler, Inhale 2 puffs into the lungs 2 (two) times daily., Disp: , Rfl:    cetirizine (ZYRTEC) 10 MG tablet, Take 10 mg by mouth daily., Disp: , Rfl:    Cholecalciferol (VITAMIN D3 PO), Take 50 mcg by mouth., Disp: , Rfl:    montelukast (SINGULAIR) 10 MG tablet, Take 10 mg by mouth at bedtime., Disp: , Rfl:    Omega-3 Fatty Acids (OMEGA 3 PO), Take 900 mg by mouth., Disp: , Rfl:    omeprazole (PRILOSEC) 20 MG capsule, Take 20 mg by mouth daily., Disp: , Rfl:    RYBELSUS 7 MG TABS, Take 1 tablet by mouth daily., Disp: , Rfl:    Zinc Sulfate (ZINC 15 PO), Take by mouth., Disp: , Rfl:   Review of Systems  Review of Systems  Constitutional: Negative for fever, chills, weight loss, malaise/fatigue and diaphoresis.  HENT: Negative for hearing loss, ear pain, nosebleeds, congestion, sore throat, neck pain, tinnitus and ear discharge.   Eyes: Negative for blurred vision, double vision, photophobia, pain, discharge and redness.  Respiratory: Negative for cough, hemoptysis, sputum production, shortness of breath, wheezing and stridor.   Cardiovascular: Negative for chest pain, palpitations, orthopnea, claudication, leg swelling and PND.  Gastrointestinal: negative for abdominal pain. Negative for heartburn, nausea, vomiting, diarrhea, constipation, blood in stool and melena.  Genitourinary: Negative for dysuria, urgency, frequency, hematuria and flank pain.  Musculoskeletal: Negative for myalgias, back pain, joint pain and falls.  Skin: Negative for itching and rash.  Neurological: Negative for dizziness, tingling, tremors, sensory change, speech change, focal weakness, seizures, loss of consciousness, weakness and headaches.  Endo/Heme/Allergies: Negative for environmental allergies and polydipsia. Does not bruise/bleed easily.  Psychiatric/Behavioral: Negative for depression, suicidal ideas, hallucinations, memory loss and substance abuse.  The patient is not nervous/anxious and does not have insomnia.        Objective:  Blood pressure (!) 152/84, pulse 68, height 5\' 3"  (1.6 m), weight 228 lb (103.4 kg), last menstrual period 11/24/2017.   Physical Exam  Vitals reviewed. Constitutional: She is oriented to person, place, and time. She appears well-developed and well-nourished.  HENT:  Head: Normocephalic and atraumatic.        Right Ear: External ear normal.  Left Ear: External ear normal.  Nose: Nose normal.  Mouth/Throat: Oropharynx is clear and moist.  Eyes: Conjunctivae and EOM are normal. Pupils are equal,  round, and reactive to light. Right eye exhibits no discharge. Left eye exhibits no discharge. No scleral icterus.  Neck: Normal range of motion. Neck supple. No tracheal deviation present. No thyromegaly present.  Cardiovascular: Normal rate, regular rhythm, normal heart sounds and intact distal pulses.  Exam reveals no gallop and no friction rub.   No murmur heard. Respiratory: Effort normal and breath sounds normal. No respiratory distress. She has no wheezes. She has no rales. She exhibits no tenderness.  GI: Soft. Bowel sounds are normal. She exhibits no distension and no mass. There is no tenderness. There is no rebound and no guarding.  Genitourinary:  Breasts no masses skin changes or nipple changes bilaterally      Vulva is normal without lesions Vagina is pink moist without discharge Cervix normal in appearance and pap is done Uterus is normal size shape and contour Adnexa is negative with normal sized ovaries  Rectal normal tone no masses heme negative Musculoskeletal: Normal range of motion. She exhibits no edema and no tenderness.  Neurological: She is alert and oriented to person, place, and time. She has normal reflexes. She displays normal reflexes. No cranial nerve deficit. She exhibits normal muscle tone. Coordination normal.  Skin: Skin is warm and dry. No rash noted. No erythema. No pallor.   Psychiatric: She has a normal mood and affect. Her behavior is normal. Judgment and thought content normal.       Medications Ordered at today's visit: No orders of the defined types were placed in this encounter.   Other orders placed at today's visit: No orders of the defined types were placed in this encounter.    ASSESSMENT + PLAN:    ICD-10-CM   1. Well woman exam with routine gynecological exam  Z01.419     2. Screening for colorectal cancer  Z12.11    Z12.12           Return in about 3 years (around 02/20/2026) for yearly.

## 2023-02-21 NOTE — Addendum Note (Signed)
Addended by: Moss Mc on: 02/21/2023 11:46 AM   Modules accepted: Orders

## 2023-02-24 LAB — CYTOLOGY - PAP
Comment: NEGATIVE
Diagnosis: NEGATIVE
High risk HPV: NEGATIVE

## 2023-03-31 ENCOUNTER — Encounter: Payer: Self-pay | Admitting: Cardiology

## 2023-03-31 ENCOUNTER — Ambulatory Visit: Payer: Commercial Managed Care - PPO | Attending: Cardiology | Admitting: Cardiology

## 2023-03-31 VITALS — BP 130/84 | HR 81 | Resp 16 | Ht 63.0 in | Wt 228.4 lb

## 2023-03-31 DIAGNOSIS — R0600 Dyspnea, unspecified: Secondary | ICD-10-CM | POA: Diagnosis not present

## 2023-03-31 DIAGNOSIS — E66813 Obesity, class 3: Secondary | ICD-10-CM | POA: Diagnosis not present

## 2023-03-31 DIAGNOSIS — I10 Essential (primary) hypertension: Secondary | ICD-10-CM | POA: Diagnosis not present

## 2023-03-31 DIAGNOSIS — Z6841 Body Mass Index (BMI) 40.0 and over, adult: Secondary | ICD-10-CM

## 2023-03-31 DIAGNOSIS — E1165 Type 2 diabetes mellitus with hyperglycemia: Secondary | ICD-10-CM

## 2023-03-31 DIAGNOSIS — R0609 Other forms of dyspnea: Secondary | ICD-10-CM

## 2023-03-31 DIAGNOSIS — E7800 Pure hypercholesterolemia, unspecified: Secondary | ICD-10-CM

## 2023-03-31 MED ORDER — ROSUVASTATIN CALCIUM 20 MG PO TABS: 20.0000 mg | ORAL_TABLET | Freq: Every day | ORAL | 0 refills | Status: DC

## 2023-03-31 NOTE — Progress Notes (Signed)
 Cardiology Office Note:  .   Date:  03/31/2023  ID:  Jean [name], DOB 1965/11/19, MRN [id number] PCP: Rosamond Leta NOVAK, MD  [facility] Providers Cardiologist:  Gordy Bergamo, MD   History of Present Illness: .   Jean [name] is a 58 y.o. Caucasian female patient with diabetes mellitus with hyperglycemia, morbid obesity, extrinsic asthma referred to me for evaluation of cardiac risks and patient concerns regarding family history of premature coronary disease.  Discussed the use of AI scribe software for clinical note transcription with the patient, who gave verbal consent to proceed.  History of Present Illness   The patient, with a family history of heart disease and personal history of prediabetes, presents for a heart check. She reports no current symptoms of chest pain, shortness of breath, or palpitations. She has a history of prediabetes, which is currently controlled with Mounjaro, but she experiences significant side effects including nausea and vomiting for two days following each injection. She has tried other medications, but none have been as effective in controlling her blood sugar levels. She also reports a significant weight loss of over 40 pounds, which has improved her overall health and reduced her symptoms of asthma. However, she has a sedentary lifestyle, with limited physical activity mainly consisting of shopping. She has a history of asthma, which is controlled, and knee problems, which limit her ability to exercise. She also reports occasional irregular heartbeats, which she describes as feeling like a panic attack.      Labs   External Labs:  Labs 07/12/2022:  Total cholesterol 176, triglycerides 84, HDL 48, LDL 111.  Hb 13.0.  Platelets 381.  Serum creatinine 0.6 x 0, EGFR 108 mL.  Potassium 4.5.  LFTs normal.  TSH normal at 2.160.  Review of Systems  Cardiovascular:  Negative for chest pain, dyspnea on exertion and leg swelling.  Respiratory:  Positive for  wheezing (reactive airway disease).    Physical Exam:   VS:  BP 130/84 (BP Location: Left Arm, Patient Position: Sitting, Cuff Size: Large)   Pulse 81   Resp 16   Ht 5' 3 (1.6 m)   Wt 228 lb 6.4 oz (103.6 kg)   LMP 11/24/2017   SpO2 97%   BMI 40.46 kg/m    Wt Readings from Last 3 Encounters:  03/31/23 228 lb 6.4 oz (103.6 kg)  02/21/23 228 lb (103.4 kg)  12/04/20 261 lb (118.4 kg)    Physical Exam Constitutional:      Appearance: She is morbidly obese.  Neck:     Vascular: No carotid bruit or JVD.  Cardiovascular:     Rate and Rhythm: Normal rate and regular rhythm.     Pulses: Intact distal pulses.     Heart sounds: Normal heart sounds. No murmur heard.    No gallop.  Pulmonary:     Effort: Pulmonary effort is normal.     Breath sounds: Normal breath sounds.  Abdominal:     General: Bowel sounds are normal.     Palpations: Abdomen is soft.  Musculoskeletal:     Right lower leg: No edema.     Left lower leg: No edema.    Studies Reviewed: .   NA EKG:    EKG Interpretation Date/Time:  Monday March 31 2023 15:44:39 EST Ventricular Rate:  77 PR Interval:  146 QRS Duration:  76 QT Interval:  384 QTC Calculation: 434 R Axis:   25  Text Interpretation: EKG 03/31/2023: Normal sinus rhythm  at the rate of 77 bpm, normal axis, no evidence of ischemia.  Low-voltage complexes.  No prior EKG available. Confirmed by Abbigail Anstey, Jagadeesh ([id number]) on 03/31/2023 4:10:10 PM    Medications and allergies    No Known Allergies   Current Outpatient Medications:    MOUNJARO 5 MG/0.5ML Pen, Inject 5 mg into the skin once a week., Disp: , Rfl:    rosuvastatin  (CRESTOR ) 20 MG tablet, Take 1 tablet (20 mg total) by mouth daily., Disp: 90 tablet, Rfl: 0   albuterol (PROVENTIL HFA;VENTOLIN HFA) 108 (90 BASE) MCG/ACT inhaler, Inhale into the lungs every 6 (six) hours as needed for wheezing or shortness of breath., Disp: , Rfl:    ascorbic acid (VITAMIN C) 500 MG tablet, Take 500 mg by  mouth daily., Disp: , Rfl:    aspirin EC 81 MG tablet, Take 81 mg by mouth daily. Swallow whole., Disp: , Rfl:    BIOTIN PO, Take 1,000 mcg by mouth., Disp: , Rfl:    budesonide-formoterol (SYMBICORT) 160-4.5 MCG/ACT inhaler, Inhale 2 puffs into the lungs 2 (two) times daily., Disp: , Rfl:    cetirizine (ZYRTEC) 10 MG tablet, Take 10 mg by mouth daily., Disp: , Rfl:    Cholecalciferol (VITAMIN D3 PO), Take 50 mcg by mouth., Disp: , Rfl:    montelukast (SINGULAIR) 10 MG tablet, Take 10 mg by mouth at bedtime., Disp: , Rfl:    Omega-3 Fatty Acids (OMEGA 3 PO), Take 900 mg by mouth., Disp: , Rfl:    omeprazole  (PRILOSEC) 20 MG capsule, Take 20 mg by mouth daily., Disp: , Rfl:    RYBELSUS 7 MG TABS, Take 1 tablet by mouth daily., Disp: , Rfl:    Zinc Sulfate (ZINC 15 PO), Take by mouth., Disp: , Rfl:    ASSESSMENT AND PLAN: .      ICD-10-CM   1. Primary hypertension  I10 EKG 12-Lead    ECHOCARDIOGRAM COMPLETE    CT CARDIAC SCORING (SELF PAY ONLY)    2. Dyspnea on exertion  R06.09 ECHOCARDIOGRAM COMPLETE    CT CARDIAC SCORING (SELF PAY ONLY)    3. Type 2 diabetes mellitus with hyperglycemia, without long-term current use of insulin (HCC)  E11.65     4. Class 3 severe obesity due to excess calories with serious comorbidity and body mass index (BMI) of 40.0 to 44.9 in adult (HCC)  Z33.186    E66.01    Z68.41     5. Pure hypercholesterolemia  E78.00 rosuvastatin  (CRESTOR ) 20 MG tablet     Assessment and Plan    Diabetes Controlled with Mounjaro, but experiencing significant side effects including nausea and vomiting. Discussed strategies to mitigate side effects including slow eating, avoiding rapid stomach distension, and avoiding certain foods. -Continue Mounjaro. -Implement dietary strategies to mitigate side effects.  Hyperlipidemia Total cholesterol 176, LDL 111. Given family history of heart disease and presence of diabetes, LDL should be less than 70. -Start Crestor  20mg   daily.  Constipation Currently using stool softeners with limited success. -Start Metamucil 1 tablespoon nightly.  Cardiovascular Risk Assessment Family history of heart disease, presence of diabetes, and recent weight loss. No current symptoms of cardiovascular disease. -Order coronary calcium  score and echocardiogram. -Consider stress test if coronary calcium  score is high.  If coronary calcium  score is low or 0, can discontinue aspirin that was started prophylactically. -Follow-up in 2 months with test results.  Hypertension No current treatment, but history of treatment with Lisinopril developed cough. Given presence of diabetes, should  be on an ARB. -Recommend starting Losartan, will communicate with PCP.  Asthma Controlled, but history of frequent prednisone use. -Continue current management, consider pulmonology consult if control worsens.    Signed,  Gordy Bergamo, MD, [facility] 03/31/2023, 9:16 PM [facility] [address] [place], [place] [address] Phone: [phone]. Fax:  [phone]

## 2023-03-31 NOTE — Patient Instructions (Signed)
 Medication Instructions:  Your physician has recommended you make the following change in your medication:  Start Rosuvastatin  20 mg by mouth daily Start metamucil 1 tablespoon every night  *If you need a refill on your cardiac medications before your next appointment, please call your pharmacy*   Lab Work: Have lipid profile checked in about 4 weeks and bring results to next office visit If you have labs (blood work) drawn today and your tests are completely normal, you will receive your results only by: MyChart Message (if you have MyChart) OR A paper copy in the mail If you have any lab test that is abnormal or we need to change your treatment, we will call you to review the results.   Testing/Procedures: Your physician has requested that you have an echocardiogram. Echocardiography is a painless test that uses sound waves to create images of your heart. It provides your doctor with information about the size and shape of your heart and how well your heart's chambers and valves are working. This procedure takes approximately one hour. There are no restrictions for this procedure. Please do NOT wear cologne, perfume, aftershave, or lotions (deodorant is allowed). Please arrive 15 minutes prior to your appointment time.  Please note: We ask at that you not bring children with you during ultrasound (echo/ vascular) testing. Due to room size and safety concerns, children are not allowed in the ultrasound rooms during exams. Our front office staff cannot provide observation of children in our lobby area while testing is being conducted. An adult accompanying a patient to their appointment will only be allowed in the ultrasound room at the discretion of the ultrasound technician under special circumstances. We apologize for any inconvenience.  Dr Ladona has requested that you have a Calcium  Score CT Scan   Follow-Up: At [facility], you and your health needs are our priority.  As  part of our continuing mission to provide you with exceptional heart care, we have created designated Provider Care Teams.  These Care Teams include your primary Cardiologist (physician) and Advanced Practice Providers (APPs -  Physician Assistants and Nurse Practitioners) who all work together to provide you with the care you need, when you need it.  We recommend signing up for the patient portal called MyChart.  Sign up information is provided on this After Visit Summary.  MyChart is used to connect with patients for Virtual Visits (Telemedicine).  Patients are able to view lab/test results, encounter notes, upcoming appointments, etc.  Non-urgent messages can be sent to your provider as well.   To learn more about what you can do with MyChart, go to [email or web address].    Your next appointment:   2 month(s)  Provider:   Gordy Ladona, MD     Other Instructions

## 2023-04-10 ENCOUNTER — Encounter: Payer: Self-pay | Admitting: Cardiology

## 2023-04-10 ENCOUNTER — Ambulatory Visit (HOSPITAL_COMMUNITY)
Admission: RE | Admit: 2023-04-10 | Discharge: 2023-04-10 | Disposition: A | Discharge status: Home / Self Care | Payer: Self-pay | Source: Ambulatory Visit | Attending: Cardiology | Admitting: Cardiology

## 2023-04-10 ENCOUNTER — Ambulatory Visit (HOSPITAL_COMMUNITY)
Admission: RE | Admit: 2023-04-10 | Discharge: 2023-04-10 | Disposition: A | Discharge status: Home / Self Care | Payer: Commercial Managed Care - PPO | Source: Ambulatory Visit | Attending: Cardiology | Admitting: Cardiology

## 2023-04-10 DIAGNOSIS — I10 Essential (primary) hypertension: Secondary | ICD-10-CM | POA: Diagnosis present

## 2023-04-10 DIAGNOSIS — R0600 Dyspnea, unspecified: Secondary | ICD-10-CM | POA: Diagnosis not present

## 2023-04-10 DIAGNOSIS — R0609 Other forms of dyspnea: Secondary | ICD-10-CM | POA: Insufficient documentation

## 2023-04-10 LAB — ECHOCARDIOGRAM COMPLETE
AR max vel: 1.66 cm2
AV Area VTI: 1.52 cm2
AV Area mean vel: 1.59 cm2
AV Mean grad: 6 mm[Hg]
AV Peak grad: 8.6 mm[Hg]
Ao pk vel: 1.47 m/s
Area-P 1/2: 3.53 cm2
S' Lateral: 2.4 cm

## 2023-04-10 NOTE — Progress Notes (Signed)
*  PRELIMINARY RESULTS* Echocardiogram 2D Echocardiogram has been performed.  Stacey Drain 04/10/2023, 9:22 AM

## 2023-04-16 NOTE — Telephone Encounter (Signed)
I spoke with patient and reviewed results with her.

## 2023-04-18 NOTE — Progress Notes (Signed)
  Your coronary calcium score is 0 placing you at low risk for cardiovascular events.  Continued primary prevention with control of cholesterol, blood pressure, weight and exercise will lead you to a very long and healthy life.   Patient has already checked this online

## 2023-04-19 NOTE — Progress Notes (Signed)
Normal echo. Your coronary calcium score is 0 placing you at low risk for cardiovascular events.    Continued primary prevention with control of cholesterol, blood pressure, weight and exercise will lead you to a very long and healthy life.

## 2023-05-14 ENCOUNTER — Ambulatory Visit (INDEPENDENT_AMBULATORY_CARE_PROVIDER_SITE_OTHER): Payer: Commercial Managed Care - PPO

## 2023-05-14 ENCOUNTER — Ambulatory Visit (INDEPENDENT_AMBULATORY_CARE_PROVIDER_SITE_OTHER): Payer: Commercial Managed Care - PPO | Admitting: Podiatry

## 2023-05-14 ENCOUNTER — Encounter: Payer: Self-pay | Admitting: Podiatry

## 2023-05-14 DIAGNOSIS — M778 Other enthesopathies, not elsewhere classified: Secondary | ICD-10-CM

## 2023-05-14 DIAGNOSIS — L923 Foreign body granuloma of the skin and subcutaneous tissue: Secondary | ICD-10-CM | POA: Diagnosis not present

## 2023-05-15 NOTE — Progress Notes (Signed)
 Subjective:   Patient ID: Jean [name], female   DOB: 57 y.o.   MRN: [id number]   HPI Patient states she has a lot of pain in her right forefoot and it has been present for around 4 to 5 months.  She does think she stepped on a piece of glass they thought they got it out but there is a sharp irritation that is occurring with ambulation.  Patient does not smoke likes to be active   Review of Systems  All other systems reviewed and are negative.       Objective:  Physical Exam Vitals and nursing note reviewed.  Constitutional:      Appearance: She is well-developed.  Pulmonary:     Effort: Pulmonary effort is normal.  Musculoskeletal:        General: Normal range of motion.  Skin:    General: Skin is warm.  Neurological:     Mental Status: She is alert.     Neurovascular status intact muscle strength found to be adequate range of motion adequate with the patient noted to have area of irritation around the plantar right foot on the lateral fourth MPJ in that area.  It is painful when pressed with a sharp discomfort      Assessment:  Possibility for a small shard of glass that may be present right     Plan:  H&P x-ray reviewed and I went ahead today and I using sterile sharp instrumentation I debrided the area with anesthesia was able to get into this area I did note that there was a small shard of glass that was able to remove I flushed the area applied sterile dressing and instructed on soaks and padding and reappoint to recheck as needed  X-rays were negative for signs of a obvious foreign body or piece of glass on x-ray

## 2023-05-27 LAB — LAB REPORT - SCANNED: EGFR (Non-African Amer.): 87

## 2023-06-05 ENCOUNTER — Ambulatory Visit: Payer: Commercial Managed Care - PPO | Attending: Cardiology | Admitting: Cardiology

## 2023-06-05 ENCOUNTER — Encounter: Payer: Self-pay | Admitting: Cardiology

## 2023-06-05 VITALS — BP 148/84 | HR 81 | Resp 16 | Ht 63.0 in | Wt 239.2 lb

## 2023-06-05 DIAGNOSIS — I10 Essential (primary) hypertension: Secondary | ICD-10-CM

## 2023-06-05 DIAGNOSIS — E66813 Obesity, class 3: Secondary | ICD-10-CM

## 2023-06-05 DIAGNOSIS — R0600 Dyspnea, unspecified: Secondary | ICD-10-CM | POA: Diagnosis not present

## 2023-06-05 DIAGNOSIS — E7800 Pure hypercholesterolemia, unspecified: Secondary | ICD-10-CM

## 2023-06-05 DIAGNOSIS — Z6841 Body Mass Index (BMI) 40.0 and over, adult: Secondary | ICD-10-CM

## 2023-06-05 DIAGNOSIS — R0609 Other forms of dyspnea: Secondary | ICD-10-CM

## 2023-06-05 MED ORDER — ROSUVASTATIN CALCIUM 20 MG PO TABS
20.0000 mg | ORAL_TABLET | Freq: Every day | ORAL | 0 refills | Status: AC
Start: 2023-06-05 — End: 2023-09-03

## 2023-06-05 NOTE — Patient Instructions (Signed)

## 2023-06-05 NOTE — Progress Notes (Signed)
 Cardiology Office Note:  .   Date:  06/07/2023  ID:  Jean [name], DOB Feb 01, 1966, MRN [id number] PCP: Ignatius Specking, MD  [facility] Providers Cardiologist:  Yates Decamp, MD   History of Present Illness: .   Jean [name] is a 58 y.o. Caucasian female patient with diabetes mellitus with hyperglycemia, morbid obesity, extrinsic asthma hide seen on 03/31/2023, had recommended an echocardiogram for dyspnea and also coronary calcium score in view of diabetes mellitus, morbid obesity and also started her on Crestor 20 mg daily.  Discussed the use of AI scribe software for clinical note transcription with the patient, who gave verbal consent to proceed.  The patient, with a history of hyperlipidemia and diabetes, presents for a medication follow-up. She reports no issues with the prescribed medications. She has been taking Crestor 20mg , which has resulted in a significant reduction in her LDL cholesterol level from 111 to 52. The patient was initially taking the medication at night but had difficulty remembering to do so. She has since switched to taking it in the morning without any problems.  The patient also reports year-round congestion, which she attributes to asthma. She has been managing this with Mucinex and Sudafed. She also mentions a family history of hyperlipidemia.  The patient's recent echocardiogram was reported as normal, and her coronary calcium score was zero, indicating a low risk of coronary artery disease. She denies any cardiac symptoms and is able to perform her daily activities without any limitations.   Labs   External Labs:  Labs brought by patient's  Labs 05/27/2023:  Serum glucose 125, BUN 15, creatinine 0.80, EGFR 87 mL, LFTs normal.  Total cholesterol 138, triglycerides 81, HDL 70, LDL 52.  Labs 07/12/2022:  Total cholesterol 176, triglycerides 84, HDL 48, LDL 111.  Hb 13.0.  Platelets 381.  Serum creatinine 0.6 x 0, EGFR 108 mL.  Potassium 4.5.  LFTs  normal.  TSH normal at 2.160  Review of Systems  Cardiovascular:  Negative for chest pain, dyspnea on exertion and leg swelling.  Respiratory:  Positive for wheezing (occasional wheezing, chronic).    Physical Exam:   VS:  BP (!) 148/84 (BP Location: Left Arm, Patient Position: Sitting, Cuff Size: Large)   Pulse 81   Resp 16   Ht 5\' 3"  (1.6 m)   Wt 239 lb 3.2 oz (108.5 kg)   LMP 11/24/2017   SpO2 96%   BMI 42.37 kg/m    Wt Readings from Last 3 Encounters:  06/05/23 239 lb 3.2 oz (108.5 kg)  03/31/23 228 lb 6.4 oz (103.6 kg)  02/21/23 228 lb (103.4 kg)    Physical Exam Constitutional:      Appearance: She is morbidly obese.  Neck:     Vascular: No carotid bruit or JVD.  Cardiovascular:     Rate and Rhythm: Normal rate and regular rhythm.     Pulses: Intact distal pulses.     Heart sounds: Normal heart sounds. No murmur heard.    No gallop.  Pulmonary:     Effort: Pulmonary effort is normal.     Breath sounds: Normal breath sounds.  Abdominal:     General: Bowel sounds are normal.     Palpations: Abdomen is soft.  Musculoskeletal:     Right lower leg: No edema.     Left lower leg: No edema.    Studies Reviewed: Marland Kitchen     ECHOCARDIOGRAM COMPLETE 04/10/2023 1. Left ventricular ejection fraction, by estimation, is 55  to 60%. The left ventricle has normal function. The left ventricle has no regional wall motion abnormalities. Left ventricular diastolic parameters were normal. 2. Right ventricular systolic function is normal. The right ventricular size is normal. Tricuspid regurgitation signal is inadequate for assessing PA pressure. 3. The mitral valve is grossly normal. No evidence of mitral valve regurgitation. No evidence of mitral stenosis. 4. The aortic valve is tricuspid. Aortic valve regurgitation is not visualized. No aortic stenosis is present. 5. The inferior vena cava is normal in size with greater than 50% respiratory variability, suggesting right atrial pressure  of 3 mmHg.  CT CARDIAC SCORING (SELF PAY ONLY) 04/10/2023  Aortic Atherosclerosis (ICD10-I70.0).  Coronary calcium score of 0 Agatston units. This suggests low risk for future cardiac events. EKG:         Medications and allergies    No Known Allergies   Current Outpatient Medications:    albuterol (PROVENTIL HFA;VENTOLIN HFA) 108 (90 BASE) MCG/ACT inhaler, Inhale into the lungs every 6 (six) hours as needed for wheezing or shortness of breath., Disp: , Rfl:    ascorbic acid (VITAMIN C) 500 MG tablet, Take 500 mg by mouth daily., Disp: , Rfl:    BIOTIN PO, Take 1,000 mcg by mouth., Disp: , Rfl:    budesonide-formoterol (SYMBICORT) 160-4.5 MCG/ACT inhaler, Inhale 2 puffs into the lungs 2 (two) times daily., Disp: , Rfl:    cetirizine (ZYRTEC) 10 MG tablet, Take 10 mg by mouth daily., Disp: , Rfl:    Cholecalciferol (VITAMIN D3 PO), Take 50 mcg by mouth., Disp: , Rfl:    montelukast (SINGULAIR) 10 MG tablet, Take 10 mg by mouth at bedtime., Disp: , Rfl:    Omega-3 Fatty Acids (OMEGA 3 PO), Take 900 mg by mouth., Disp: , Rfl:    omeprazole (PRILOSEC) 20 MG capsule, Take 20 mg by mouth daily., Disp: , Rfl:    Zinc Sulfate (ZINC 15 PO), Take by mouth., Disp: , Rfl:    rosuvastatin (CRESTOR) 20 MG tablet, Take 1 tablet (20 mg total) by mouth daily., Disp: 30 tablet, Rfl: 0   ASSESSMENT AND PLAN: .      ICD-10-CM   1. Dyspnea on exertion  R06.09     2. Primary hypertension  I10     3. Pure hypercholesterolemia  E78.00 rosuvastatin (CRESTOR) 20 MG tablet    4. Class 3 severe obesity due to excess calories with serious comorbidity and body mass index (BMI) of 40.0 to 44.9 in adult [facility])  Z61.096    E66.01    Z68.41       Meds ordered this encounter  Medications   rosuvastatin (CRESTOR) 20 MG tablet    Sig: Take 1 tablet (20 mg total) by mouth daily.    Dispense:  30 tablet    Refill:  0    Refills to Dr. Doreen Beam    Medications Discontinued During This Encounter  Medication  Reason   aspirin EC 81 MG tablet Patient Preference   MOUNJARO 5 MG/0.5ML Pen Side effect (s)   RYBELSUS 7 MG TABS Ineffective   rosuvastatin (CRESTOR) 20 MG tablet Reorder   Assessment and Plan Assessment & Plan Hyperlipidemia   LDL cholesterol has improved from 111 to 52 with Crestor 20 mg, achieving optimal levels. At 58 years old with a family history of hyperlipidemia, continued management is crucial to prevent cardiovascular events. She tolerates the medication well and has been informed it can be taken at any time of the day, aiding  adherence. Primary prevention is emphasized given her age and risk factors. Continue Crestor 20 mg daily. Provide a 30-day prescription for Crestor to bridge until the next appointment in May. Ensure understanding of the importance of continued medication adherence for primary prevention of cardiovascular events.  Coronary artery disease risk assessment   Coronary calcium score is zero, indicating a very low risk of coronary artery disease. This score places her at the best possible position compared to peers, suggesting effective primary prevention strategies are in place. No stress testing is required as she is asymptomatic and physically active. Continue current preventive measures, including cholesterol management.  Obesity: She now appears to lean towards bariatric surgery and given her age of 18 years, if she were to loose weight, she has a lot to gain in terms of long term overall health risks and I encouraged her to go ahead and plan this.  [employer] has a Network engineer for [employer] and highest dose of 10 mg that can be prescribed cost about $450 a month, lower doses of at 250 hours, and can be considered.  Otherwise from cardiac standpoint she remains stable, in the absence of symptoms, risk factors being well-managed except obesity, I will see her back on a as needed basis.  As she denied any symptoms of chest pain or dyspnea,  states that she exercises regularly without any discomfort, stress test was not performed and also her coronary calcium score being 0 places her at a low risk.  She is tolerating statins and I would highly recommend we continue the dose for now, prescription refills via her PCP.  Asthma   Reports year-round congestion and wheezing, consistent with asthma. Currently managing symptoms with Mucinex and Sudafed. Symptoms suggest mild asthma exacerbation without acute distress. Continue current over-the-counter medications (Mucinex and Sudafed) as needed for congestion and wheezing.       Signed,  Yates Decamp, MD, [facility] 06/07/2023, 4:25 PM [facility] [address] [place], [place] [address] Phone: [phone]. Fax:  [phone]

## 2023-12-19 ENCOUNTER — Other Ambulatory Visit (HOSPITAL_COMMUNITY): Payer: Self-pay | Admitting: Obstetrics & Gynecology

## 2023-12-19 DIAGNOSIS — Z1231 Encounter for screening mammogram for malignant neoplasm of breast: Secondary | ICD-10-CM

## 2023-12-31 ENCOUNTER — Encounter (INDEPENDENT_AMBULATORY_CARE_PROVIDER_SITE_OTHER): Payer: Self-pay | Admitting: Gastroenterology

## 2024-01-23 ENCOUNTER — Ambulatory Visit (HOSPITAL_COMMUNITY)

## 2024-02-06 ENCOUNTER — Ambulatory Visit (HOSPITAL_COMMUNITY)

## 2024-03-15 ENCOUNTER — Ambulatory Visit (HOSPITAL_COMMUNITY)
Admission: RE | Admit: 2024-03-15 | Discharge: 2024-03-15 | Disposition: A | Discharge status: Home / Self Care | Source: Ambulatory Visit | Attending: Obstetrics & Gynecology | Admitting: Obstetrics & Gynecology

## 2024-03-15 ENCOUNTER — Encounter (HOSPITAL_COMMUNITY): Payer: Self-pay

## 2024-03-15 DIAGNOSIS — Z1231 Encounter for screening mammogram for malignant neoplasm of breast: Secondary | ICD-10-CM | POA: Diagnosis present
